# Patient Record
Sex: Female | Born: 1965 | Race: White | Hispanic: No | Marital: Married | Smoking: Former smoker
Health system: Southern US, Community
[De-identification: ages and names within clinical notes are randomized; demographics above are authoritative.]

## PROBLEM LIST (undated history)

## (undated) DIAGNOSIS — E669 Obesity, unspecified: Secondary | ICD-10-CM

## (undated) DIAGNOSIS — T7840XA Allergy, unspecified, initial encounter: Secondary | ICD-10-CM

## (undated) DIAGNOSIS — E663 Overweight: Secondary | ICD-10-CM

## (undated) DIAGNOSIS — E870 Hyperosmolality and hypernatremia: Secondary | ICD-10-CM

## (undated) DIAGNOSIS — E785 Hyperlipidemia, unspecified: Secondary | ICD-10-CM

## (undated) DIAGNOSIS — Z0000 Encounter for general adult medical examination without abnormal findings: Secondary | ICD-10-CM

## (undated) DIAGNOSIS — F329 Major depressive disorder, single episode, unspecified: Secondary | ICD-10-CM

## (undated) DIAGNOSIS — R51 Headache: Secondary | ICD-10-CM

## (undated) DIAGNOSIS — E079 Disorder of thyroid, unspecified: Secondary | ICD-10-CM

## (undated) DIAGNOSIS — R519 Headache, unspecified: Secondary | ICD-10-CM

## (undated) DIAGNOSIS — F32A Depression, unspecified: Secondary | ICD-10-CM

## (undated) HISTORY — DX: Hyperlipidemia, unspecified: E78.5

## (undated) HISTORY — DX: Major depressive disorder, single episode, unspecified: F32.9

## (undated) HISTORY — DX: Obesity, unspecified: E66.9

## (undated) HISTORY — DX: Headache, unspecified: R51.9

## (undated) HISTORY — DX: Headache: R51

## (undated) HISTORY — DX: Overweight: E66.3

## (undated) HISTORY — DX: Disorder of thyroid, unspecified: E07.9

## (undated) HISTORY — DX: Encounter for general adult medical examination without abnormal findings: Z00.00

## (undated) HISTORY — DX: Depression, unspecified: F32.A

## (undated) HISTORY — DX: Allergy, unspecified, initial encounter: T78.40XA

## (undated) HISTORY — DX: Hyperosmolality and hypernatremia: E87.0

---

## 1997-11-11 ENCOUNTER — Other Ambulatory Visit: Admission: RE | Admit: 1997-11-11 | Discharge: 1997-11-11 | Payer: Self-pay | Admitting: Obstetrics and Gynecology

## 1998-05-19 ENCOUNTER — Other Ambulatory Visit: Admission: RE | Admit: 1998-05-19 | Discharge: 1998-05-19 | Payer: Self-pay | Admitting: Obstetrics and Gynecology

## 1998-08-13 ENCOUNTER — Ambulatory Visit (HOSPITAL_COMMUNITY): Admission: RE | Admit: 1998-08-13 | Discharge: 1998-08-13 | Payer: Self-pay | Admitting: Obstetrics and Gynecology

## 1999-01-14 ENCOUNTER — Other Ambulatory Visit: Admission: RE | Admit: 1999-01-14 | Discharge: 1999-01-14 | Payer: Self-pay | Admitting: Obstetrics and Gynecology

## 1999-07-27 ENCOUNTER — Inpatient Hospital Stay (HOSPITAL_COMMUNITY): Admission: AD | Admit: 1999-07-27 | Discharge: 1999-07-30 | Payer: Self-pay | Admitting: Obstetrics and Gynecology

## 1999-08-03 ENCOUNTER — Encounter: Admission: RE | Admit: 1999-08-03 | Discharge: 1999-11-01 | Payer: Self-pay | Admitting: Obstetrics and Gynecology

## 1999-08-29 ENCOUNTER — Other Ambulatory Visit: Admission: RE | Admit: 1999-08-29 | Discharge: 1999-08-29 | Payer: Self-pay | Admitting: Obstetrics and Gynecology

## 2001-03-01 ENCOUNTER — Other Ambulatory Visit: Admission: RE | Admit: 2001-03-01 | Discharge: 2001-03-01 | Payer: Self-pay | Admitting: Obstetrics & Gynecology

## 2014-08-11 ENCOUNTER — Ambulatory Visit: Payer: Self-pay | Admitting: Family Medicine

## 2014-12-01 ENCOUNTER — Telehealth: Payer: Self-pay | Admitting: *Deleted

## 2014-12-01 NOTE — Telephone Encounter (Signed)
Unable to reach patient at time of Pre-Visit Call.  Left message for patient to return call when available.    

## 2014-12-03 ENCOUNTER — Encounter: Payer: Self-pay | Admitting: Family Medicine

## 2014-12-03 ENCOUNTER — Ambulatory Visit (INDEPENDENT_AMBULATORY_CARE_PROVIDER_SITE_OTHER): Payer: BLUE CROSS/BLUE SHIELD | Admitting: Family Medicine

## 2014-12-03 VITALS — BP 110/80 | HR 85 | Temp 98.1°F | Ht 68.5 in | Wt 217.1 lb

## 2014-12-03 DIAGNOSIS — R519 Headache, unspecified: Secondary | ICD-10-CM

## 2014-12-03 DIAGNOSIS — R51 Headache: Secondary | ICD-10-CM | POA: Diagnosis not present

## 2014-12-03 DIAGNOSIS — T7840XA Allergy, unspecified, initial encounter: Secondary | ICD-10-CM

## 2014-12-03 DIAGNOSIS — E663 Overweight: Secondary | ICD-10-CM

## 2014-12-03 DIAGNOSIS — E669 Obesity, unspecified: Secondary | ICD-10-CM

## 2014-12-03 DIAGNOSIS — E079 Disorder of thyroid, unspecified: Secondary | ICD-10-CM

## 2014-12-03 DIAGNOSIS — E785 Hyperlipidemia, unspecified: Secondary | ICD-10-CM | POA: Diagnosis not present

## 2014-12-03 DIAGNOSIS — Z0000 Encounter for general adult medical examination without abnormal findings: Secondary | ICD-10-CM

## 2014-12-03 HISTORY — DX: Overweight: E66.3

## 2014-12-03 HISTORY — DX: Obesity, unspecified: E66.9

## 2014-12-03 NOTE — Progress Notes (Signed)
Betty Houston  [id number] 1966-05-21 12/03/2014      Progress Note-Follow Up  Subjective  Chief Complaint  No chief complaint on file.   HPI  Patient is a 49 y.o. female in today for routine medical care. Patient is in today to establish care. She feels fairly well to present but is in need of a primary care doctor. Following with [facility] in [place] as well as [facility]. No recent illness or acute concerns. Has had a great deal of labwork recently at [facility]. Maintains a heart healthy diet. Is exercising regularly. Denies CP/palp/SOB/HA/congestion/fevers/GI or GU c/o. Taking meds as prescribed  History reviewed. No pertinent past medical history.  History reviewed. No pertinent past surgical history.  History reviewed. No pertinent family history.  History   Social History  . Marital Status: Married    Spouse Name: N/A  . Number of Children: N/A  . Years of Education: N/A   Occupational History  . Not on file.   Social History Main Topics  . Smoking status: Not on file  . Smokeless tobacco: Not on file  . Alcohol Use: Not on file  . Drug Use: Not on file  . Sexual Activity: Not on file   Other Topics Concern  . Not on file   Social History Narrative  . No narrative on file    No current outpatient prescriptions on file prior to visit.   No current facility-administered medications on file prior to visit.    Not on File  Review of Systems  Review of Systems  Constitutional: Negative for fever and malaise/fatigue.  HENT: Negative for congestion.   Eyes: Negative for discharge.  Respiratory: Negative for shortness of breath.   Cardiovascular: Negative for chest pain, palpitations and leg swelling.  Gastrointestinal: Negative for nausea, abdominal pain and diarrhea.  Genitourinary: Negative for dysuria.  Musculoskeletal: Negative for falls.  Skin: Negative for rash.  Neurological: Negative for loss of  consciousness and headaches.  Endo/Heme/Allergies: Negative for polydipsia.  Psychiatric/Behavioral: Negative for depression and suicidal ideas. The patient is not nervous/anxious and does not have insomnia.     Objective  Blood pressure 110/80, pulse 85, temperature 98.1 F (36.7 C), temperature source Oral, height 5' 8.5" (1.74 m), weight 217 lb 2 oz (98.487 kg), last menstrual period 12/03/2014, SpO2 95 %. Physical Exam  Physical Exam  Constitutional: She is oriented to person, place, and time and well-developed, well-nourished, and in no distress. No distress.  HENT:  Head: Normocephalic and atraumatic.  Eyes: Conjunctivae are normal.  Neck: Neck supple. No thyromegaly present.  Cardiovascular: Normal rate, regular rhythm and normal heart sounds.   No murmur heard. Pulmonary/Chest: Effort normal and breath sounds normal. She has no wheezes.  Abdominal: She exhibits no distension and no mass.  Musculoskeletal: She exhibits no edema.  Lymphadenopathy:    She has no cervical adenopathy.  Neurological: She is alert and oriented to person, place, and time.  Skin: Skin is warm and dry. No rash noted. She is not diaphoretic.  Psychiatric: Memory, affect and judgment normal.    Assessment & Plan   Allergy Follows with [facility], feeling better avoid certain foods, Testing confirms sensitivities to gluten, dairy, corn, tapioca, blackberries,peanuts, almonds  Obesity Encouraged DASH diet, decrease po intake and increase exercise as tolerated. Needs 7-8 hours of sleep nightly. Avoid trans fats, eat small, frequent meals every 4-5 hours with lean proteins, complex carbs and healthy fats. Minimize simple carbs, GMO  foods.  Hyperlipidemia Encouraged heart healthy diet, increase exercise, avoid trans fats, consider a krill oil cap daily  Headache Encouraged increased hydration, 64 ounces of clear fluids daily. Minimize alcohol and caffeine. Eat small frequent meals with  lean proteins and complex carbs. Avoid high and low blood sugars. Get adequate sleep, 7-8 hours a night. Needs exercise daily preferably in the morning.  Thyroid disease Patient follows with [facility] in [facility] and chooses to use [facility] thyroid, feels well  Preventative health care Is doing well had labs done at [facility] will request records Follows with [facility], Dr Minda Meo

## 2014-12-03 NOTE — Assessment & Plan Note (Signed)
Follows with [facility], feeling better avoid certain foods, Testing confirms sensitivities to gluten, dairy, corn, tapioca, blackberries,peanuts, almonds

## 2014-12-03 NOTE — Patient Instructions (Signed)
Preventive Care for Adults A healthy lifestyle and preventive care can promote health and wellness. Preventive health guidelines for women include the following key practices.  A routine yearly physical is a good way to check with your health care provider about your health and preventive screening. It is a chance to share any concerns and updates on your health and to receive a thorough exam.  Visit your dentist for a routine exam and preventive care every 6 months. Brush your teeth twice a day and floss once a day. Good oral hygiene prevents tooth decay and gum disease.  The frequency of eye exams is based on your age, health, family medical history, use of contact lenses, and other factors. Follow your health care provider's recommendations for frequency of eye exams.  Eat a healthy diet. Foods like vegetables, fruits, whole grains, low-fat dairy products, and lean protein foods contain the nutrients you need without too many calories. Decrease your intake of foods high in solid fats, added sugars, and salt. Eat the right amount of calories for you.Get information about a proper diet from your health care provider, if necessary.  Regular physical exercise is one of the most important things you can do for your health. Most adults should get at least 150 minutes of moderate-intensity exercise (any activity that increases your heart rate and causes you to sweat) each week. In addition, most adults need muscle-strengthening exercises on 2 or more days a week.  Maintain a healthy weight. The body mass index (BMI) is a screening tool to identify possible weight problems. It provides an estimate of body fat based on height and weight. Your health care provider can find your BMI and can help you achieve or maintain a healthy weight.For adults 20 years and older:  A BMI below 18.5 is considered underweight.  A BMI of 18.5 to 24.9 is normal.  A BMI of 25 to 29.9 is considered overweight.  A BMI of  30 and above is considered obese.  Maintain normal blood lipids and cholesterol levels by exercising and minimizing your intake of saturated fat. Eat a balanced diet with plenty of fruit and vegetables. Blood tests for lipids and cholesterol should begin at age 76 and be repeated every 5 years. If your lipid or cholesterol levels are high, you are over 50, or you are at high risk for heart disease, you may need your cholesterol levels checked more frequently.Ongoing high lipid and cholesterol levels should be treated with medicines if diet and exercise are not working.  If you smoke, find out from your health care provider how to quit. If you do not use tobacco, do not start.  Lung cancer screening is recommended for adults aged 22-80 years who are at high risk for developing lung cancer because of a history of smoking. A yearly low-dose CT scan of the lungs is recommended for people who have at least a 30-pack-year history of smoking and are a current smoker or have quit within the past 15 years. A pack year of smoking is smoking an average of 1 pack of cigarettes a day for 1 year (for example: 1 pack a day for 30 years or 2 packs a day for 15 years). Yearly screening should continue until the smoker has stopped smoking for at least 15 years. Yearly screening should be stopped for people who develop a health problem that would prevent them from having lung cancer treatment.  If you are pregnant, do not drink alcohol. If you are breastfeeding,  be very cautious about drinking alcohol. If you are not pregnant and choose to drink alcohol, do not have more than 1 drink per day. One drink is considered to be 12 ounces (355 mL) of beer, 5 ounces (148 mL) of wine, or 1.5 ounces (44 mL) of liquor.  Avoid use of street drugs. Do not share needles with anyone. Ask for help if you need support or instructions about stopping the use of drugs.  High blood pressure causes heart disease and increases the risk of  stroke. Your blood pressure should be checked at least every 1 to 2 years. Ongoing high blood pressure should be treated with medicines if weight loss and exercise do not work.  If you are 75-52 years old, ask your health care provider if you should take aspirin to prevent strokes.  Diabetes screening involves taking a blood sample to check your fasting blood sugar level. This should be done once every 3 years, after age 15, if you are within normal weight and without risk factors for diabetes. Testing should be considered at a younger age or be carried out more frequently if you are overweight and have at least 1 risk factor for diabetes.  Breast cancer screening is essential preventive care for women. You should practice "breast self-awareness." This means understanding the normal appearance and feel of your breasts and may include breast self-examination. Any changes detected, no matter how small, should be reported to a health care provider. Women in their 58s and 30s should have a clinical breast exam (CBE) by a health care provider as part of a regular health exam every 1 to 3 years. After age 16, women should have a CBE every year. Starting at age 53, women should consider having a mammogram (breast X-ray test) every year. Women who have a family history of breast cancer should talk to their health care provider about genetic screening. Women at a high risk of breast cancer should talk to their health care providers about having an MRI and a mammogram every year.  Breast cancer gene (BRCA)-related cancer risk assessment is recommended for women who have family members with BRCA-related cancers. BRCA-related cancers include breast, ovarian, tubal, and peritoneal cancers. Having family members with these cancers may be associated with an increased risk for harmful changes (mutations) in the breast cancer genes BRCA1 and BRCA2. Results of the assessment will determine the need for genetic counseling and  BRCA1 and BRCA2 testing.  Routine pelvic exams to screen for cancer are no longer recommended for nonpregnant women who are considered low risk for cancer of the pelvic organs (ovaries, uterus, and vagina) and who do not have symptoms. Ask your health care provider if a screening pelvic exam is right for you.  If you have had past treatment for cervical cancer or a condition that could lead to cancer, you need Pap tests and screening for cancer for at least 20 years after your treatment. If Pap tests have been discontinued, your risk factors (such as having a new sexual partner) need to be reassessed to determine if screening should be resumed. Some women have medical problems that increase the chance of getting cervical cancer. In these cases, your health care provider may recommend more frequent screening and Pap tests.  The HPV test is an additional test that may be used for cervical cancer screening. The HPV test looks for the virus that can cause the cell changes on the cervix. The cells collected during the Pap test can be  tested for HPV. The HPV test could be used to screen women aged 30 years and older, and should be used in women of any age who have unclear Pap test results. After the age of 30, women should have HPV testing at the same frequency as a Pap test.  Colorectal cancer can be detected and often prevented. Most routine colorectal cancer screening begins at the age of 50 years and continues through age 75 years. However, your health care provider may recommend screening at an earlier age if you have risk factors for colon cancer. On a yearly basis, your health care provider may provide home test kits to check for hidden blood in the stool. Use of a small camera at the end of a tube, to directly examine the colon (sigmoidoscopy or colonoscopy), can detect the earliest forms of colorectal cancer. Talk to your health care provider about this at age 50, when routine screening begins. Direct  exam of the colon should be repeated every 5-10 years through age 75 years, unless early forms of pre-cancerous polyps or small growths are found.  People who are at an increased risk for hepatitis B should be screened for this virus. You are considered at high risk for hepatitis B if:  You were born in a country where hepatitis B occurs often. Talk with your health care provider about which countries are considered high risk.  Your parents were born in a high-risk country and you have not received a shot to protect against hepatitis B (hepatitis B vaccine).  You have HIV or AIDS.  You use needles to inject street drugs.  You live with, or have sex with, someone who has hepatitis B.  You get hemodialysis treatment.  You take certain medicines for conditions like cancer, organ transplantation, and autoimmune conditions.  Hepatitis C blood testing is recommended for all people born from 1945 through 1965 and any individual with known risks for hepatitis C.  Practice safe sex. Use condoms and avoid high-risk sexual practices to reduce the spread of sexually transmitted infections (STIs). STIs include gonorrhea, chlamydia, syphilis, trichomonas, herpes, HPV, and human immunodeficiency virus (HIV). Herpes, HIV, and HPV are viral illnesses that have no cure. They can result in disability, cancer, and death.  You should be screened for sexually transmitted illnesses (STIs) including gonorrhea and chlamydia if:  You are sexually active and are younger than 24 years.  You are older than 24 years and your health care provider tells you that you are at risk for this type of infection.  Your sexual activity has changed since you were last screened and you are at an increased risk for chlamydia or gonorrhea. Ask your health care provider if you are at risk.  If you are at risk of being infected with HIV, it is recommended that you take a prescription medicine daily to prevent HIV infection. This is  called preexposure prophylaxis (PrEP). You are considered at risk if:  You are a heterosexual woman, are sexually active, and are at increased risk for HIV infection.  You take drugs by injection.  You are sexually active with a partner who has HIV.  Talk with your health care provider about whether you are at high risk of being infected with HIV. If you choose to begin PrEP, you should first be tested for HIV. You should then be tested every 3 months for as long as you are taking PrEP.  Osteoporosis is a disease in which the bones lose minerals and strength   with aging. This can result in serious bone fractures or breaks. The risk of osteoporosis can be identified using a bone density scan. Women ages 65 years and over and women at risk for fractures or osteoporosis should discuss screening with their health care providers. Ask your health care provider whether you should take a calcium supplement or vitamin D to reduce the rate of osteoporosis.  Menopause can be associated with physical symptoms and risks. Hormone replacement therapy is available to decrease symptoms and risks. You should talk to your health care provider about whether hormone replacement therapy is right for you.  Use sunscreen. Apply sunscreen liberally and repeatedly throughout the day. You should seek shade when your shadow is shorter than you. Protect yourself by wearing long sleeves, pants, a wide-brimmed hat, and sunglasses year round, whenever you are outdoors.  Once a month, do a whole body skin exam, using a mirror to look at the skin on your back. Tell your health care provider of new moles, moles that have irregular borders, moles that are larger than a pencil eraser, or moles that have changed in shape or color.  Stay current with required vaccines (immunizations).  Influenza vaccine. All adults should be immunized every year.  Tetanus, diphtheria, and acellular pertussis (Td, Tdap) vaccine. Pregnant women should  receive 1 dose of Tdap vaccine during each pregnancy. The dose should be obtained regardless of the length of time since the last dose. Immunization is preferred during the 27th-36th week of gestation. An adult who has not previously received Tdap or who does not know her vaccine status should receive 1 dose of Tdap. This initial dose should be followed by tetanus and diphtheria toxoids (Td) booster doses every 10 years. Adults with an unknown or incomplete history of completing a 3-dose immunization series with Td-containing vaccines should begin or complete a primary immunization series including a Tdap dose. Adults should receive a Td booster every 10 years.  Varicella vaccine. An adult without evidence of immunity to varicella should receive 2 doses or a second dose if she has previously received 1 dose. Pregnant females who do not have evidence of immunity should receive the first dose after pregnancy. This first dose should be obtained before leaving the health care facility. The second dose should be obtained 4-8 weeks after the first dose.  Human papillomavirus (HPV) vaccine. Females aged 13-26 years who have not received the vaccine previously should obtain the 3-dose series. The vaccine is not recommended for use in pregnant females. However, pregnancy testing is not needed before receiving a dose. If a female is found to be pregnant after receiving a dose, no treatment is needed. In that case, the remaining doses should be delayed until after the pregnancy. Immunization is recommended for any person with an immunocompromised condition through the age of 26 years if she did not get any or all doses earlier. During the 3-dose series, the second dose should be obtained 4-8 weeks after the first dose. The third dose should be obtained 24 weeks after the first dose and 16 weeks after the second dose.  Zoster vaccine. One dose is recommended for adults aged 60 years or older unless certain conditions are  present.  Measles, mumps, and rubella (MMR) vaccine. Adults born before 1957 generally are considered immune to measles and mumps. Adults born in 1957 or later should have 1 or more doses of MMR vaccine unless there is a contraindication to the vaccine or there is laboratory evidence of immunity to   each of the three diseases. A routine second dose of MMR vaccine should be obtained at least 28 days after the first dose for students attending postsecondary schools, health care workers, or international travelers. People who received inactivated measles vaccine or an unknown type of measles vaccine during 1963-1967 should receive 2 doses of MMR vaccine. People who received inactivated mumps vaccine or an unknown type of mumps vaccine before 1979 and are at high risk for mumps infection should consider immunization with 2 doses of MMR vaccine. For females of childbearing age, rubella immunity should be determined. If there is no evidence of immunity, females who are not pregnant should be vaccinated. If there is no evidence of immunity, females who are pregnant should delay immunization until after pregnancy. Unvaccinated health care workers born before 1957 who lack laboratory evidence of measles, mumps, or rubella immunity or laboratory confirmation of disease should consider measles and mumps immunization with 2 doses of MMR vaccine or rubella immunization with 1 dose of MMR vaccine.  Pneumococcal 13-valent conjugate (PCV13) vaccine. When indicated, a person who is uncertain of her immunization history and has no record of immunization should receive the PCV13 vaccine. An adult aged 19 years or older who has certain medical conditions and has not been previously immunized should receive 1 dose of PCV13 vaccine. This PCV13 should be followed with a dose of pneumococcal polysaccharide (PPSV23) vaccine. The PPSV23 vaccine dose should be obtained at least 8 weeks after the dose of PCV13 vaccine. An adult aged 19  years or older who has certain medical conditions and previously received 1 or more doses of PPSV23 vaccine should receive 1 dose of PCV13. The PCV13 vaccine dose should be obtained 1 or more years after the last PPSV23 vaccine dose.  Pneumococcal polysaccharide (PPSV23) vaccine. When PCV13 is also indicated, PCV13 should be obtained first. All adults aged 65 years and older should be immunized. An adult younger than age 65 years who has certain medical conditions should be immunized. Any person who resides in a nursing home or long-term care facility should be immunized. An adult smoker should be immunized. People with an immunocompromised condition and certain other conditions should receive both PCV13 and PPSV23 vaccines. People with human immunodeficiency virus (HIV) infection should be immunized as soon as possible after diagnosis. Immunization during chemotherapy or radiation therapy should be avoided. Routine use of PPSV23 vaccine is not recommended for American Indians, [place] Natives, or people younger than 65 years unless there are medical conditions that require PPSV23 vaccine. When indicated, people who have unknown immunization and have no record of immunization should receive PPSV23 vaccine. One-time revaccination 5 years after the first dose of PPSV23 is recommended for people aged 19-64 years who have chronic kidney failure, nephrotic syndrome, asplenia, or immunocompromised conditions. People who received 1-2 doses of PPSV23 before age 65 years should receive another dose of PPSV23 vaccine at age 65 years or later if at least 5 years have passed since the previous dose. Doses of PPSV23 are not needed for people immunized with PPSV23 at or after age 65 years.  Meningococcal vaccine. Adults with asplenia or persistent complement component deficiencies should receive 2 doses of quadrivalent meningococcal conjugate (MenACWY-D) vaccine. The doses should be obtained at least 2 months apart.  Microbiologists working with certain meningococcal bacteria, military recruits, people at risk during an outbreak, and people who travel to or live in countries with a high rate of meningitis should be immunized. A first-year college student up through age   21 years who is living in a residence hall should receive a dose if she did not receive a dose on or after her 16th birthday. Adults who have certain high-risk conditions should receive one or more doses of vaccine.  Hepatitis A vaccine. Adults who wish to be protected from this disease, have certain high-risk conditions, work with hepatitis A-infected animals, work in hepatitis A research labs, or travel to or work in countries with a high rate of hepatitis A should be immunized. Adults who were previously unvaccinated and who anticipate close contact with an international adoptee during the first 60 days after arrival in the [place] from a country with a high rate of hepatitis A should be immunized.  Hepatitis B vaccine. Adults who wish to be protected from this disease, have certain high-risk conditions, may be exposed to blood or other infectious body fluids, are household contacts or sex partners of hepatitis B positive people, are clients or workers in certain care facilities, or travel to or work in countries with a high rate of hepatitis B should be immunized.  Haemophilus influenzae type b (Hib) vaccine. A previously unvaccinated person with asplenia or sickle cell disease or having a scheduled splenectomy should receive 1 dose of Hib vaccine. Regardless of previous immunization, a recipient of a hematopoietic stem cell transplant should receive a 3-dose series 6-12 months after her successful transplant. Hib vaccine is not recommended for adults with HIV infection. Preventive Services / Frequency Ages 64 to 68 years  Blood pressure check.** / Every 1 to 2 years.  Lipid and cholesterol check.** / Every 5 years beginning at age  22.  Clinical breast exam.** / Every 3 years for women in their 88s and 53s.  BRCA-related cancer risk assessment.** / For women who have family members with a BRCA-related cancer (breast, ovarian, tubal, or peritoneal cancers).  Pap test.** / Every 2 years from ages 90 through 51. Every 3 years starting at age 21 through age 56 or 3 with a history of 3 consecutive normal Pap tests.  HPV screening.** / Every 3 years from ages 24 through ages 1 to 46 with a history of 3 consecutive normal Pap tests.  Hepatitis C blood test.** / For any individual with known risks for hepatitis C.  Skin self-exam. / Monthly.  Influenza vaccine. / Every year.  Tetanus, diphtheria, and acellular pertussis (Tdap, Td) vaccine.** / Consult your health care provider. Pregnant women should receive 1 dose of Tdap vaccine during each pregnancy. 1 dose of Td every 10 years.  Varicella vaccine.** / Consult your health care provider. Pregnant females who do not have evidence of immunity should receive the first dose after pregnancy.  HPV vaccine. / 3 doses over 6 months, if 72 and younger. The vaccine is not recommended for use in pregnant females. However, pregnancy testing is not needed before receiving a dose.  Measles, mumps, rubella (MMR) vaccine.** / You need at least 1 dose of MMR if you were born in 1957 or later. You may also need a 2nd dose. For females of childbearing age, rubella immunity should be determined. If there is no evidence of immunity, females who are not pregnant should be vaccinated. If there is no evidence of immunity, females who are pregnant should delay immunization until after pregnancy.  Pneumococcal 13-valent conjugate (PCV13) vaccine.** / Consult your health care provider.  Pneumococcal polysaccharide (PPSV23) vaccine.** / 1 to 2 doses if you smoke cigarettes or if you have certain conditions.  Meningococcal vaccine.** /  1 dose if you are age 19 to 21 years and a first-year college  student living in a residence hall, or have one of several medical conditions, you need to get vaccinated against meningococcal disease. You may also need additional booster doses.  Hepatitis A vaccine.** / Consult your health care provider.  Hepatitis B vaccine.** / Consult your health care provider.  Haemophilus influenzae type b (Hib) vaccine.** / Consult your health care provider. Ages 40 to 64 years  Blood pressure check.** / Every 1 to 2 years.  Lipid and cholesterol check.** / Every 5 years beginning at age 20 years.  Lung cancer screening. / Every year if you are aged 55-80 years and have a 30-pack-year history of smoking and currently smoke or have quit within the past 15 years. Yearly screening is stopped once you have quit smoking for at least 15 years or develop a health problem that would prevent you from having lung cancer treatment.  Clinical breast exam.** / Every year after age 40 years.  BRCA-related cancer risk assessment.** / For women who have family members with a BRCA-related cancer (breast, ovarian, tubal, or peritoneal cancers).  Mammogram.** / Every year beginning at age 40 years and continuing for as long as you are in good health. Consult with your health care provider.  Pap test.** / Every 3 years starting at age 30 years through age 65 or 70 years with a history of 3 consecutive normal Pap tests.  HPV screening.** / Every 3 years from ages 30 years through ages 65 to 70 years with a history of 3 consecutive normal Pap tests.  Fecal occult blood test (FOBT) of stool. / Every year beginning at age 50 years and continuing until age 75 years. You may not need to do this test if you get a colonoscopy every 10 years.  Flexible sigmoidoscopy or colonoscopy.** / Every 5 years for a flexible sigmoidoscopy or every 10 years for a colonoscopy beginning at age 50 years and continuing until age 75 years.  Hepatitis C blood test.** / For all people born from 1945 through  1965 and any individual with known risks for hepatitis C.  Skin self-exam. / Monthly.  Influenza vaccine. / Every year.  Tetanus, diphtheria, and acellular pertussis (Tdap/Td) vaccine.** / Consult your health care provider. Pregnant women should receive 1 dose of Tdap vaccine during each pregnancy. 1 dose of Td every 10 years.  Varicella vaccine.** / Consult your health care provider. Pregnant females who do not have evidence of immunity should receive the first dose after pregnancy.  Zoster vaccine.** / 1 dose for adults aged 60 years or older.  Measles, mumps, rubella (MMR) vaccine.** / You need at least 1 dose of MMR if you were born in 1957 or later. You may also need a 2nd dose. For females of childbearing age, rubella immunity should be determined. If there is no evidence of immunity, females who are not pregnant should be vaccinated. If there is no evidence of immunity, females who are pregnant should delay immunization until after pregnancy.  Pneumococcal 13-valent conjugate (PCV13) vaccine.** / Consult your health care provider.  Pneumococcal polysaccharide (PPSV23) vaccine.** / 1 to 2 doses if you smoke cigarettes or if you have certain conditions.  Meningococcal vaccine.** / Consult your health care provider.  Hepatitis A vaccine.** / Consult your health care provider.  Hepatitis B vaccine.** / Consult your health care provider.  Haemophilus influenzae type b (Hib) vaccine.** / Consult your health care provider. Ages 65   years and over  Blood pressure check.** / Every 1 to 2 years.  Lipid and cholesterol check.** / Every 5 years beginning at age 22 years.  Lung cancer screening. / Every year if you are aged 73-80 years and have a 30-pack-year history of smoking and currently smoke or have quit within the past 15 years. Yearly screening is stopped once you have quit smoking for at least 15 years or develop a health problem that would prevent you from having lung cancer  treatment.  Clinical breast exam.** / Every year after age 4 years.  BRCA-related cancer risk assessment.** / For women who have family members with a BRCA-related cancer (breast, ovarian, tubal, or peritoneal cancers).  Mammogram.** / Every year beginning at age 40 years and continuing for as long as you are in good health. Consult with your health care provider.  Pap test.** / Every 3 years starting at age 9 years through age 34 or 91 years with 3 consecutive normal Pap tests. Testing can be stopped between 65 and 70 years with 3 consecutive normal Pap tests and no abnormal Pap or HPV tests in the past 10 years.  HPV screening.** / Every 3 years from ages 57 years through ages 64 or 45 years with a history of 3 consecutive normal Pap tests. Testing can be stopped between 65 and 70 years with 3 consecutive normal Pap tests and no abnormal Pap or HPV tests in the past 10 years.  Fecal occult blood test (FOBT) of stool. / Every year beginning at age 15 years and continuing until age 17 years. You may not need to do this test if you get a colonoscopy every 10 years.  Flexible sigmoidoscopy or colonoscopy.** / Every 5 years for a flexible sigmoidoscopy or every 10 years for a colonoscopy beginning at age 86 years and continuing until age 71 years.  Hepatitis C blood test.** / For all people born from [birth year] through 1967 and any individual with known risks for hepatitis C.  Osteoporosis screening.** / A one-time screening for women ages 83 years and over and women at risk for fractures or osteoporosis.  Skin self-exam. / Monthly.  Influenza vaccine. / Every year.  Tetanus, diphtheria, and acellular pertussis (Tdap/Td) vaccine.** / 1 dose of Td every 10 years.  Varicella vaccine.** / Consult your health care provider.  Zoster vaccine.** / 1 dose for adults aged 61 years or older.  Pneumococcal 13-valent conjugate (PCV13) vaccine.** / Consult your health care provider.  Pneumococcal  polysaccharide (PPSV23) vaccine.** / 1 dose for all adults aged 28 years and older.  Meningococcal vaccine.** / Consult your health care provider.  Hepatitis A vaccine.** / Consult your health care provider.  Hepatitis B vaccine.** / Consult your health care provider.  Haemophilus influenzae type b (Hib) vaccine.** / Consult your health care provider. ** Family history and personal history of risk and conditions may change your health care provider's recommendations. Document Released: 08/01/2001 Document Revised: 10/20/2013 Document Reviewed: 10/31/2010 [facility] Patient Information 2015 [place], [place]. This information is not intended to replace advice given to you by your health care provider. Make sure you discuss any questions you have with your health care provider.

## 2014-12-03 NOTE — Progress Notes (Signed)
Pre visit review using our clinic review tool, if applicable. No additional management support is needed unless otherwise documented below in the visit note. 

## 2014-12-06 ENCOUNTER — Encounter: Payer: Self-pay | Admitting: Family Medicine

## 2014-12-06 DIAGNOSIS — E785 Hyperlipidemia, unspecified: Secondary | ICD-10-CM | POA: Insufficient documentation

## 2014-12-06 DIAGNOSIS — Z0000 Encounter for general adult medical examination without abnormal findings: Secondary | ICD-10-CM

## 2014-12-06 DIAGNOSIS — R51 Headache: Secondary | ICD-10-CM

## 2014-12-06 DIAGNOSIS — R519 Headache, unspecified: Secondary | ICD-10-CM | POA: Insufficient documentation

## 2014-12-06 HISTORY — DX: Encounter for general adult medical examination without abnormal findings: Z00.00

## 2014-12-06 NOTE — Assessment & Plan Note (Signed)
Encouraged DASH diet, decrease po intake and increase exercise as tolerated. Needs 7-8 hours of sleep nightly. Avoid trans fats, eat small, frequent meals every 4-5 hours with lean proteins, complex carbs and healthy fats. Minimize simple carbs, GMO foods. 

## 2014-12-06 NOTE — Assessment & Plan Note (Addendum)
Patient follows with [facility] in [facility] and chooses to use [place] thyroid, feels well

## 2014-12-06 NOTE — Assessment & Plan Note (Signed)
Encouraged heart healthy diet, increase exercise, avoid trans fats, consider a krill oil cap daily 

## 2014-12-06 NOTE — Assessment & Plan Note (Addendum)
Is doing well had labs done at [facility] will request records Follows with [facility], Dr Minda Meo

## 2014-12-06 NOTE — Assessment & Plan Note (Signed)
Encouraged increased hydration, 64 ounces of clear fluids daily. Minimize alcohol and caffeine. Eat small frequent meals with lean proteins and complex carbs. Avoid high and low blood sugars. Get adequate sleep, 7-8 hours a night. Needs exercise daily preferably in the morning.  

## 2014-12-09 ENCOUNTER — Telehealth: Payer: Self-pay | Admitting: *Deleted

## 2014-12-09 NOTE — Telephone Encounter (Signed)
Medical records received via fax from [facility]. Forwarded to Dr. Carmelia Bake. JG//CMA

## 2015-02-25 LAB — HM MAMMOGRAPHY: HM Mammogram: NORMAL (ref 0–4)

## 2015-02-26 ENCOUNTER — Encounter: Payer: Self-pay | Admitting: Podiatry

## 2015-02-26 ENCOUNTER — Ambulatory Visit (INDEPENDENT_AMBULATORY_CARE_PROVIDER_SITE_OTHER): Payer: BLUE CROSS/BLUE SHIELD | Admitting: Podiatry

## 2015-02-26 VITALS — BP 134/63 | HR 66 | Ht 68.5 in | Wt 209.0 lb

## 2015-02-26 DIAGNOSIS — M79675 Pain in left toe(s): Secondary | ICD-10-CM | POA: Diagnosis not present

## 2015-02-26 DIAGNOSIS — L600 Ingrowing nail: Secondary | ICD-10-CM | POA: Diagnosis not present

## 2015-02-26 NOTE — Patient Instructions (Signed)
Ingrown nail surgery was done. Follow soaking instruction.  Some redness and drainage is expected. Call the office if the area gets feverish with increased redness and drainage. 

## 2015-02-26 NOTE — Progress Notes (Signed)
Subjective: 49 year old female presents complaining of both side left great toe nail ingrown, more on inside. Had this problem off and on for several years. Patient wants to get it fixed.  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 Has had ingrown nail surgery done on right in past and doing well.  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  Plan: Procedure done: Phenol and Alcohol matrixectomy left great toe both borders.   Affected left toe was anesthetized with total 5ml mixture of 50/50 0.5% Marcaine plain and 1% Xylocaine plain. Affected both nail borders were reflected with a nail elevator and excised with nail nipper. Proximal nail matrix tissue on both borders were cauterized with Phenol soaked cotton applicator x 4 and neutralized with Alcohol soaked cotton applicator. The wound was dressed with Amerigel ointment dressing. Home care instructions and supply dispensed.  Return in 1 week for follow up.

## 2015-03-05 ENCOUNTER — Encounter: Payer: Self-pay | Admitting: Podiatry

## 2015-03-05 ENCOUNTER — Ambulatory Visit (INDEPENDENT_AMBULATORY_CARE_PROVIDER_SITE_OTHER): Payer: BLUE CROSS/BLUE SHIELD | Admitting: Podiatry

## 2015-03-05 ENCOUNTER — Encounter: Payer: BLUE CROSS/BLUE SHIELD | Admitting: Podiatry

## 2015-03-05 DIAGNOSIS — L600 Ingrowing nail: Secondary | ICD-10-CM

## 2015-03-05 NOTE — Progress Notes (Signed)
Post op nail surgery. Toe is doing well without problem. Minimum discomfort. Continue to soak till it is pain free.

## 2015-03-05 NOTE — Patient Instructions (Signed)
Post op nail healing well. Continue to soak another week. Return as needed.

## 2015-06-04 ENCOUNTER — Ambulatory Visit (INDEPENDENT_AMBULATORY_CARE_PROVIDER_SITE_OTHER): Payer: BLUE CROSS/BLUE SHIELD | Admitting: Family Medicine

## 2015-06-04 ENCOUNTER — Encounter: Payer: Self-pay | Admitting: Family Medicine

## 2015-06-04 VITALS — BP 126/78 | HR 79 | Temp 98.0°F | Ht 68.0 in | Wt 191.4 lb

## 2015-06-04 DIAGNOSIS — E079 Disorder of thyroid, unspecified: Secondary | ICD-10-CM

## 2015-06-04 DIAGNOSIS — Z23 Encounter for immunization: Secondary | ICD-10-CM

## 2015-06-04 DIAGNOSIS — E669 Obesity, unspecified: Secondary | ICD-10-CM

## 2015-06-04 DIAGNOSIS — E782 Mixed hyperlipidemia: Secondary | ICD-10-CM | POA: Diagnosis not present

## 2015-06-04 DIAGNOSIS — E785 Hyperlipidemia, unspecified: Secondary | ICD-10-CM | POA: Diagnosis not present

## 2015-06-04 DIAGNOSIS — Z1159 Encounter for screening for other viral diseases: Secondary | ICD-10-CM

## 2015-06-04 DIAGNOSIS — E875 Hyperkalemia: Secondary | ICD-10-CM

## 2015-06-04 DIAGNOSIS — E870 Hyperosmolality and hypernatremia: Secondary | ICD-10-CM

## 2015-06-04 LAB — COMPREHENSIVE METABOLIC PANEL
ALBUMIN: 3.9 g/dL (ref 3.5–5.2)
ALK PHOS: 34 U/L — AB (ref 39–117)
ALT: 9 U/L (ref 0–35)
AST: 13 U/L (ref 0–37)
BILIRUBIN TOTAL: 0.6 mg/dL (ref 0.2–1.2)
BUN: 13 mg/dL (ref 6–23)
CALCIUM: 10.1 mg/dL (ref 8.4–10.5)
CO2: 32 meq/L (ref 19–32)
Chloride: 108 mEq/L (ref 96–112)
Creatinine, Ser: 0.86 mg/dL (ref 0.40–1.20)
GFR: 73.71 mL/min (ref >60.00)
Glucose, Bld: 98 mg/dL (ref 70–99)
Potassium: 5.7 mEq/L — ABNORMAL HIGH (ref 3.5–5.1)
Sodium: 146 mEq/L — ABNORMAL HIGH (ref 135–145)
TOTAL PROTEIN: 6.6 g/dL (ref 6.0–8.3)

## 2015-06-04 LAB — LIPID PANEL
CHOL/HDL RATIO: 3
CHOLESTEROL: 133 mg/dL (ref 0–200)
HDL: 40.9 mg/dL (ref >39.00)
LDL Cholesterol: 61 mg/dL (ref 0–99)
NonHDL: 92.43
TRIGLYCERIDES: 155 mg/dL — AB (ref 0.0–149.0)
VLDL: 31 mg/dL (ref 0.0–40.0)

## 2015-06-04 NOTE — Patient Instructions (Addendum)
Encouraged increased rest and hydration, add probiotics, zinc such as Coldeze or Xicam. Treat fevers as needed Elderberry, Vitamin C,  Aged or black garlic, Echinacea  Basic Carbohydrate Counting for Diabetes Mellitus Carbohydrate counting is a method for keeping track of the amount of carbohydrates you eat. Eating carbohydrates naturally increases the level of sugar (glucose) in your blood, so it is important for you to know the amount that is okay for you to have in every meal. Carbohydrate counting helps keep the level of glucose in your blood within normal limits. The amount of carbohydrates allowed is different for every person. A dietitian can help you calculate the amount that is right for you. Once you know the amount of carbohydrates you can have, you can count the carbohydrates in the foods you want to eat. Carbohydrates are found in the following foods:  Grains, such as breads and cereals.  Dried beans and soy products.  Starchy vegetables, such as potatoes, peas, and corn.  Fruit and fruit juices.  Milk and yogurt.  Sweets and snack foods, such as cake, cookies, candy, chips, soft drinks, and fruit drinks. CARBOHYDRATE COUNTING There are two ways to count the carbohydrates in your food. You can use either of the methods or a combination of both. Reading the "Nutrition Facts" on Packaged Food The "Nutrition Facts" is an area that is included on the labels of almost all packaged food and beverages in the [place]. It includes the serving size of that food or beverage and information about the nutrients in each serving of the food, including the grams (g) of carbohydrate per serving.  Decide the number of servings of this food or beverage that you will be able to eat or drink. Multiply that number of servings by the number of grams of carbohydrate that is listed on the label for that serving. The total will be the amount of carbohydrates you will be having when you eat or drink  this food or beverage. Learning Standard Serving Sizes of Food When you eat food that is not packaged or does not include "Nutrition Facts" on the label, you need to measure the servings in order to count the amount of carbohydrates.A serving of most carbohydrate-rich foods contains about 15 g of carbohydrates. The following list includes serving sizes of carbohydrate-rich foods that provide 15 g ofcarbohydrate per serving:   1 slice of bread (1 oz) or 1 six-inch tortilla.    of a hamburger bun or English muffin.  4-6 crackers.   cup unsweetened dry cereal.    cup hot cereal.   cup rice or pasta.    cup mashed potatoes or  of a large baked potato.  1 cup fresh fruit or one small piece of fruit.    cup canned or frozen fruit or fruit juice.  1 cup milk.   cup plain fat-free yogurt or yogurt sweetened with artificial sweeteners.   cup cooked dried beans or starchy vegetable, such as peas, corn, or potatoes.  Decide the number of standard-size servings that you will eat. Multiply that number of servings by 15 (the grams of carbohydrates in that serving). For example, if you eat 2 cups of strawberries, you will have eaten 2 servings and 30 g of carbohydrates (2 servings x 15 g = 30 g). For foods such as soups and casseroles, in which more than one food is mixed in, you will need to count the carbohydrates in each food that is included. EXAMPLE OF CARBOHYDRATE COUNTING Sample  Dinner  3 oz chicken breast.   cup of brown rice.   cup of corn.  1 cup milk.   1 cup strawberries with sugar-free whipped topping.  Carbohydrate Calculation Step 1: Identify the foods that contain carbohydrates:   Rice.   Corn.   Milk.   Strawberries. Step 2:Calculate the number of servings eaten of each:   2 servings of rice.   1 serving of corn.   1 serving of milk.   1 serving of strawberries. Step 3: Multiply each of those number of servings by 15 g:   2  servings of rice x 15 g = 30 g.   1 serving of corn x 15 g = 15 g.   1 serving of milk x 15 g = 15 g.   1 serving of strawberries x 15 g = 15 g. Step 4: Add together all of the amounts to find the total grams of carbohydrates eaten: 30 g + 15 g + 15 g + 15 g = 75 g.   This information is not intended to replace advice given to you by your health care provider. Make sure you discuss any questions you have with your health care provider.   Document Released: 06/05/2005 Document Revised: 06/26/2014 Document Reviewed: 05/02/2013 Elsevier Interactive Patient Education [employer].

## 2015-06-04 NOTE — Progress Notes (Signed)
Pre visit review using our clinic review tool, if applicable. No additional management support is needed unless otherwise documented below in the visit note. 

## 2015-06-05 LAB — HEPATITIS C ANTIBODY: HCV Ab: NEGATIVE

## 2015-06-06 ENCOUNTER — Encounter: Payer: Self-pay | Admitting: Family Medicine

## 2015-06-06 DIAGNOSIS — E870 Hyperosmolality and hypernatremia: Secondary | ICD-10-CM

## 2015-06-06 DIAGNOSIS — E875 Hyperkalemia: Secondary | ICD-10-CM | POA: Insufficient documentation

## 2015-06-06 HISTORY — DX: Hyperosmolality and hypernatremia: E87.0

## 2015-06-06 NOTE — Assessment & Plan Note (Signed)
Good weight loss continue current diet.

## 2015-06-06 NOTE — Assessment & Plan Note (Signed)
Taking nature thyroid with her natural health care provider.

## 2015-06-06 NOTE — Assessment & Plan Note (Signed)
Minimize in diet and recheck cmp

## 2015-06-06 NOTE — Assessment & Plan Note (Signed)
Encouraged heart healthy diet, increase exercise, avoid trans fats, consider a krill oil cap daily. Continues to see [facility] as well

## 2015-06-06 NOTE — Assessment & Plan Note (Signed)
Mild.will repeat cmp

## 2015-06-06 NOTE — Progress Notes (Signed)
Subjective:    Patient ID: Betty Houston, female    DOB: 1965/10/28, 49 y.o.   MRN: [id number]  Chief Complaint  Patient presents with  . Follow-up    HPI Patient is in today for follow-up. She is feeling well. She is happy with her current dose of her nature thyroid she receives [facility]. She reports good weight loss and improvement in her energy levels. She denies any acute concerns. She's had no recent illness. She is taking a herb called Ashwaganda for her stress and finds it helpful. Denies CP/palp/SOB/HA/congestion/fevers/GI or GU c/o. Taking meds as prescribed  Past Medical History  Diagnosis Date  . Depression   . Headache   . Obesity 12/03/2014  . Allergy     food  . Thyroid disease   . Hyperlipidemia   . Preventative health care 12/06/2014  . Hypernatremia 06/06/2015    Past Surgical History  Procedure Laterality Date  . Cesarean section  2001    Family History  Problem Relation Age of Onset  . Heart disease Mother   . Cancer Mother     breast cancer  . Cancer Father     lung cancer, smoker  . Cancer Sister     brain  . Cancer Maternal Aunt     colon cancer  . Arthritis Brother   . Birth defects Brother     missing toe, shorter leg  . Goiter Maternal Grandmother   . Other Sister     ?breast cancer, died in car accident.    Social History   Social History  . Marital Status: Married    Spouse Name: N/A  . Number of Children: N/A  . Years of Education: 16   Occupational History  . Admin. Assistant/Office Manager    Social History Main Topics  . Smoking status: Former Games developer  . Smokeless tobacco: Never Used     Comment: stopped smoking in 2007. patient smoked for approximately 25 years.  . Alcohol Use: 0.0 oz/week    0 Standard drinks or equivalent per week     Comment: rarely maybe a glass of wine  . Drug Use: No  . Sexual Activity: Yes     Comment: lives with husband and son, avoids foods including dairy and gluten, works as an  Chemical engineer,   Other Topics Concern  . Not on file   Social History Narrative    Outpatient Prescriptions Prior to Visit  Medication Sig Dispense Refill  . Ferrous Fumarate (IRON) 18 MG TBCR Take by mouth daily.    . magnesium oxide (MAG-OX) 400 MG tablet Take 400 mg by mouth daily.    . NON FORMULARY Take 75 mg by mouth daily.    . progesterone (PROMETRIUM) 100 MG capsule Take 100 mg by mouth daily.    . Thyroid (NATURE-THROID) 97.5 MG TABS Take 97.5 mg by mouth 2 (two) times daily.    . Vitamin K, Phytonadione, 100 MCG TABS Take 150 tablets by mouth daily.    . ergocalciferol (VITAMIN D2) 50000 UNITS capsule Take 50,000 Units by mouth once a week.     No facility-administered medications prior to visit.    No Known Allergies  Review of Systems  Constitutional: Negative for fever and malaise/fatigue.  HENT: Negative for congestion.   Eyes: Negative for discharge.  Respiratory: Negative for shortness of breath.   Cardiovascular: Negative for chest pain, palpitations, claudication and leg swelling.  Gastrointestinal: Negative for nausea and abdominal pain.  Genitourinary:  Negative for dysuria.  Musculoskeletal: Negative for falls.  Skin: Negative for rash.  Neurological: Negative for loss of consciousness and headaches.  Endo/Heme/Allergies: Negative for environmental allergies.  Psychiatric/Behavioral: Negative for depression. The patient is not nervous/anxious.        Objective:    Physical Exam  Constitutional: She is oriented to person, place, and time. She appears well-developed and well-nourished. No distress.  HENT:  Head: Normocephalic and atraumatic.  Nose: Nose normal.  Eyes: Right eye exhibits no discharge. Left eye exhibits no discharge.  Neck: Normal range of motion. Neck supple.  Cardiovascular: Normal rate and regular rhythm.   No murmur heard. Pulmonary/Chest: Effort normal and breath sounds normal.  Abdominal: Soft. Bowel sounds are normal. There  is no tenderness.  Musculoskeletal: She exhibits no edema.  Neurological: She is alert and oriented to person, place, and time.  Skin: Skin is warm and dry.  Psychiatric: She has a normal mood and affect.  Nursing note and vitals reviewed.   BP 126/78 mmHg  Pulse 79  Temp(Src) 98 F (36.7 C) (Oral)  Ht 5\' 8"  (1.727 m)  Wt 191 lb 6 oz (86.807 kg)  BMI 29.11 kg/m2  SpO2 97% Wt Readings from Last 3 Encounters:  06/04/15 191 lb 6 oz (86.807 kg)  02/26/15 209 lb (94.802 kg)  12/03/14 217 lb 2 oz (98.487 kg)     Lab Results  Component Value Date   GLUCOSE 98 06/04/2015   CHOL 133 06/04/2015   TRIG 155.0* 06/04/2015   HDL 40.90 06/04/2015   LDLCALC 61 06/04/2015   ALT 9 06/04/2015   AST 13 06/04/2015   NA 146* 06/04/2015   K 5.7* 06/04/2015   CL 108 06/04/2015   CREATININE 0.86 06/04/2015   BUN 13 06/04/2015   CO2 32 06/04/2015    No results found for: TSH No results found for: WBC, HGB, HCT, MCV, PLT Lab Results  Component Value Date   NA 146* 06/04/2015   K 5.7* 06/04/2015   CO2 32 06/04/2015   GLUCOSE 98 06/04/2015   BUN 13 06/04/2015   CREATININE 0.86 06/04/2015   BILITOT 0.6 06/04/2015   ALKPHOS 34* 06/04/2015   AST 13 06/04/2015   ALT 9 06/04/2015   PROT 6.6 06/04/2015   ALBUMIN 3.9 06/04/2015   CALCIUM 10.1 06/04/2015   GFR 73.71 06/04/2015   Lab Results  Component Value Date   CHOL 133 06/04/2015   Lab Results  Component Value Date   HDL 40.90 06/04/2015   Lab Results  Component Value Date   LDLCALC 61 06/04/2015   Lab Results  Component Value Date   TRIG 155.0* 06/04/2015   Lab Results  Component Value Date   CHOLHDL 3 06/04/2015   No results found for: HGBA1C     Assessment & Plan:   Problem List Items Addressed This Visit    Hyperkalemia    Minimize in diet and recheck cmp      Hyperlipidemia    Encouraged heart healthy diet, increase exercise, avoid trans fats, consider a krill oil cap daily. Continues to see [facility] as well      Hypernatremia    Mild.will repeat cmp      Obesity    Good weight loss continue current diet.       Thyroid disease    Taking nature thyroid with her natural health care provider.        Other Visit Diagnoses    Encounter for immunization    -  Primary    Hyperlipidemia, mixed        Relevant Orders    Lipid panel (Completed)    Comprehensive metabolic panel (Completed)    Screening for viral disease        Relevant Orders    Hepatitis C antibody (Completed)       I have discontinued Ms. Acton's ergocalciferol. I am also having her maintain her Thyroid, progesterone, magnesium oxide, Vitamin K (Phytonadione), Iron, NON FORMULARY, Cholecalciferol, OVER THE COUNTER MEDICATION, Turmeric, cyanocobalamin, Multiple Vitamins-Minerals (MULTIVITAMIN PO), vitamin C, and NON FORMULARY.  Meds ordered this encounter  Medications  . Cholecalciferol 5000 UNITS capsule    Sig: Take 5,000 Units by mouth daily.  Marland Kitchen OVER THE COUNTER MEDICATION    Sig: Take 1 capsule by mouth 2 (two) times daily. pregnonolone hormone 75 mg two times daily  . Turmeric 500 MG CAPS    Sig: Take by mouth daily.  . cyanocobalamin 1000 MCG tablet    Sig: Take 500 mcg by mouth 2 (two) times daily.  . Multiple Vitamins-Minerals (MULTIVITAMIN PO)    Sig: Take by mouth daily.  . Ascorbic Acid (VITAMIN C) 1000 MG tablet    Sig: Take 1,000 mg by mouth daily.  . NON FORMULARY    Sig: Ashwaghanda 400 mg daily     Danise Edge, MD

## 2015-06-07 ENCOUNTER — Other Ambulatory Visit: Payer: Self-pay | Admitting: Family Medicine

## 2015-06-07 DIAGNOSIS — E875 Hyperkalemia: Secondary | ICD-10-CM

## 2015-06-08 ENCOUNTER — Other Ambulatory Visit (INDEPENDENT_AMBULATORY_CARE_PROVIDER_SITE_OTHER): Payer: BLUE CROSS/BLUE SHIELD

## 2015-06-08 DIAGNOSIS — E875 Hyperkalemia: Secondary | ICD-10-CM

## 2015-06-08 LAB — COMPREHENSIVE METABOLIC PANEL
ALBUMIN: 4 g/dL (ref 3.5–5.2)
ALT: 11 U/L (ref 0–35)
AST: 14 U/L (ref 0–37)
Alkaline Phosphatase: 32 U/L — ABNORMAL LOW (ref 39–117)
BILIRUBIN TOTAL: 0.6 mg/dL (ref 0.2–1.2)
BUN: 14 mg/dL (ref 6–23)
CALCIUM: 9.6 mg/dL (ref 8.4–10.5)
CHLORIDE: 107 meq/L (ref 96–112)
CO2: 26 meq/L (ref 19–32)
Creatinine, Ser: 0.74 mg/dL (ref 0.40–1.20)
GFR: 87.66 mL/min (ref >60.00)
Glucose, Bld: 94 mg/dL (ref 70–99)
Potassium: 3.9 mEq/L (ref 3.5–5.1)
Sodium: 142 mEq/L (ref 135–145)
Total Protein: 6.6 g/dL (ref 6.0–8.3)

## 2015-06-09 ENCOUNTER — Encounter: Payer: Self-pay | Admitting: Family Medicine

## 2015-12-13 ENCOUNTER — Ambulatory Visit (INDEPENDENT_AMBULATORY_CARE_PROVIDER_SITE_OTHER): Payer: Managed Care, Other (non HMO) | Admitting: Family Medicine

## 2015-12-13 ENCOUNTER — Encounter: Payer: Self-pay | Admitting: Family Medicine

## 2015-12-13 VITALS — BP 122/72 | HR 60 | Temp 97.9°F | Ht 68.0 in | Wt 173.4 lb

## 2015-12-13 DIAGNOSIS — T7840XA Allergy, unspecified, initial encounter: Secondary | ICD-10-CM

## 2015-12-13 DIAGNOSIS — E785 Hyperlipidemia, unspecified: Secondary | ICD-10-CM

## 2015-12-13 DIAGNOSIS — E079 Disorder of thyroid, unspecified: Secondary | ICD-10-CM

## 2015-12-13 DIAGNOSIS — Z0000 Encounter for general adult medical examination without abnormal findings: Secondary | ICD-10-CM

## 2015-12-13 DIAGNOSIS — R519 Headache, unspecified: Secondary | ICD-10-CM

## 2015-12-13 DIAGNOSIS — R51 Headache: Secondary | ICD-10-CM

## 2015-12-13 NOTE — Progress Notes (Signed)
Patient ID: Betty Houston, female   DOB: 1966/06/04, 50 y.o.   MRN: [id number]   Subjective:    Patient ID: Betty Houston, female    DOB: [birth year], 50 y.o.   MRN: [id number]  Chief Complaint  Patient presents with  . Annual Exam    HPI Patient is in today for annual exam. Is doing well at this time. Is seeing [facility] and they are arranging her diet and her thyroid. Is happy with weight loss with diet and exercise changes. Denies CP/palp/SOB/HA/congestion/fevers/GI or GU c/o. Taking meds as prescribed.   Past Medical History  Diagnosis Date  . Depression   . Headache   . Obesity 12/03/2014  . Allergy     food  . Thyroid disease   . Hyperlipidemia   . Preventative health care 12/06/2014  . Hypernatremia 06/06/2015    Past Surgical History  Procedure Laterality Date  . Cesarean section  2001    Family History  Problem Relation Age of Onset  . Heart disease Mother   . Cancer Mother     breast cancer  . Cancer Father     lung cancer, smoker  . Cancer Sister     brain  . Cancer Maternal Aunt     colon cancer  . Arthritis Brother   . Birth defects Brother     missing toe, shorter leg  . Goiter Maternal Grandmother   . Other Sister     ?breast cancer, died in car accident.    Social History   Social History  . Marital Status: Married    Spouse Name: N/A  . Number of Children: N/A  . Years of Education: 16   Occupational History  . Admin. Assistant/Office Manager    Social History Main Topics  . Smoking status: Former Games developer  . Smokeless tobacco: Never Used     Comment: stopped smoking in 2007. patient smoked for approximately 25 years.  . Alcohol Use: 0.0 oz/week    0 Standard drinks or equivalent per week     Comment: rarely maybe a glass of wine  . Drug Use: No  . Sexual Activity: Yes     Comment: lives with husband and son, avoids foods including dairy and gluten, works as an Chemical engineer,   Other Topics Concern  . Not on file   Social  History Narrative    Outpatient Prescriptions Prior to Visit  Medication Sig Dispense Refill  . Ascorbic Acid (VITAMIN C) 1000 MG tablet Take 1,000 mg by mouth daily.    . Cholecalciferol 5000 UNITS capsule Take 5,000 Units by mouth daily.    . cyanocobalamin 1000 MCG tablet Take 500 mcg by mouth 2 (two) times daily.    . Ferrous Fumarate (IRON) 18 MG TBCR Take by mouth daily.    . magnesium oxide (MAG-OX) 400 MG tablet Take 400 mg by mouth daily.    . Multiple Vitamins-Minerals (MULTIVITAMIN PO) Take by mouth daily.    . NON FORMULARY Take 75 mg by mouth daily.    . NON FORMULARY Ashwaghanda 400 mg daily    . OVER THE COUNTER MEDICATION Take 1 capsule by mouth 2 (two) times daily. pregnonolone hormone 75 mg two times daily    . progesterone (PROMETRIUM) 100 MG capsule Take 100 mg by mouth daily.    . Thyroid (NATURE-THROID) 97.5 MG TABS Take 97.5 mg by mouth 2 (two) times daily.    . Turmeric 500 MG CAPS Take by mouth daily.    Marland Kitchen  Vitamin K, Phytonadione, 100 MCG TABS Take 150 tablets by mouth daily.     No facility-administered medications prior to visit.    No Known Allergies  Review of Systems  Constitutional: Negative for fever, chills and malaise/fatigue.  HENT: Negative for congestion and hearing loss.   Eyes: Negative for discharge.  Respiratory: Negative for cough, sputum production and shortness of breath.   Cardiovascular: Negative for chest pain, palpitations and leg swelling.  Gastrointestinal: Negative for heartburn, nausea, vomiting, abdominal pain, diarrhea, constipation and blood in stool.  Genitourinary: Negative for dysuria, urgency, frequency and hematuria.  Musculoskeletal: Negative for myalgias, back pain and falls.  Skin: Negative for rash.  Neurological: Negative for dizziness, sensory change, loss of consciousness, weakness and headaches.  Endo/Heme/Allergies: Negative for environmental allergies. Does not bruise/bleed easily.  Psychiatric/Behavioral:  Negative for depression and suicidal ideas. The patient is not nervous/anxious and does not have insomnia.        Objective:    Physical Exam  Constitutional: She is oriented to person, place, and time. She appears well-developed and well-nourished. No distress.  HENT:  Head: Normocephalic and atraumatic.  Eyes: Conjunctivae are normal.  Neck: Neck supple. No thyromegaly present.  Cardiovascular: Normal rate, regular rhythm and normal heart sounds.   No murmur heard. Pulmonary/Chest: Effort normal and breath sounds normal. No respiratory distress.  Abdominal: Soft. Bowel sounds are normal. She exhibits no distension and no mass. There is no tenderness.  Musculoskeletal: She exhibits no edema.  Lymphadenopathy:    She has no cervical adenopathy.  Neurological: She is alert and oriented to person, place, and time.  Skin: Skin is warm and dry.  Psychiatric: She has a normal mood and affect. Her behavior is normal.    BP 108/68 mmHg  Pulse 91  Temp(Src) 97.9 F (36.6 C) (Oral)  Ht 5\' 8"  (1.727 m)  Wt 173 lb 6 oz (78.642 kg)  BMI 26.37 kg/m2  SpO2 95% Wt Readings from Last 3 Encounters:  12/13/15 173 lb 6 oz (78.642 kg)  06/04/15 191 lb 6 oz (86.807 kg)  02/26/15 209 lb (94.802 kg)     Lab Results  Component Value Date   GLUCOSE 94 06/08/2015   CHOL 133 06/04/2015   TRIG 155.0* 06/04/2015   HDL 40.90 06/04/2015   LDLCALC 61 06/04/2015   ALT 11 06/08/2015   AST 14 06/08/2015   NA 142 06/08/2015   K 3.9 06/08/2015   CL 107 06/08/2015   CREATININE 0.74 06/08/2015   BUN 14 06/08/2015   CO2 26 06/08/2015    No results found for: TSH No results found for: WBC, HGB, HCT, MCV, PLT Lab Results  Component Value Date   NA 142 06/08/2015   K 3.9 06/08/2015   CO2 26 06/08/2015   GLUCOSE 94 06/08/2015   BUN 14 06/08/2015   CREATININE 0.74 06/08/2015   BILITOT 0.6 06/08/2015   ALKPHOS 32* 06/08/2015   AST 14 06/08/2015   ALT 11 06/08/2015   PROT 6.6 06/08/2015    ALBUMIN 4.0 06/08/2015   CALCIUM 9.6 06/08/2015   GFR 87.66 06/08/2015   Lab Results  Component Value Date   CHOL 133 06/04/2015   Lab Results  Component Value Date   HDL 40.90 06/04/2015   Lab Results  Component Value Date   LDLCALC 61 06/04/2015   Lab Results  Component Value Date   TRIG 155.0* 06/04/2015   Lab Results  Component Value Date   CHOLHDL 3 06/04/2015   No results found  for: HGBA1C     Assessment & Plan:   Problem List Items Addressed This Visit    Thyroid disease    Her thyroid is being monitored by her alternative practitioner, no recent changes.      Preventative health care - Primary    Patient encouraged to maintain heart healthy diet, regular exercise, adequate sleep. Consider daily probiotics. Take medications as prescribed. Given and reviewed copy of ACP documents from [employer] and encouraged to complete and return      Hyperlipidemia    Encouraged heart healthy diet, increase exercise, avoid trans fats, consider a krill oil cap daily      Headache    Did have a mild issue with increasing progesterone with her alternative practitioner at [facility], Marilynne Halsted, who is unfortunately moving out of the area. Denies CP/palp/SOB/HA/congestion/fevers/GI or GU c/o. Taking meds as prescribed      Allergy    Has increased myalgias with the wrong foods, especially corn and gluten. Could add ginger to diet prn.         I am having Ms. Novello maintain her Thyroid, progesterone, magnesium oxide, Vitamin K (Phytonadione), Iron, NON FORMULARY, Cholecalciferol, OVER THE COUNTER MEDICATION, Turmeric, cyanocobalamin, Multiple Vitamins-Minerals (MULTIVITAMIN PO), vitamin C, and NON FORMULARY.  No orders of the defined types were placed in this encounter.     Danise Edge, MD

## 2015-12-13 NOTE — Assessment & Plan Note (Signed)
Her thyroid is being monitored by her alternative practitioner, no recent changes.

## 2015-12-13 NOTE — Assessment & Plan Note (Signed)
Has increased myalgias with the wrong foods, especially corn and gluten. Could add ginger to diet prn.

## 2015-12-13 NOTE — Assessment & Plan Note (Signed)
Did have a mild issue with increasing progesterone with her alternative practitioner [facility], Marilynne HalstedHaley Neil, who is unfortunately moving out of the area. Denies CP/palp/SOB/HA/congestion/fevers/GI or GU c/o. Taking meds as prescribed

## 2015-12-13 NOTE — Patient Instructions (Signed)
Preventive Care for Adults, Female A healthy lifestyle and preventive care can promote health and wellness. Preventive health guidelines for women include the following key practices.  A routine yearly physical is a good way to check with your health care provider about your health and preventive screening. It is a chance to share any concerns and updates on your health and to receive a thorough exam.  Visit your dentist for a routine exam and preventive care every 6 months. Brush your teeth twice a day and floss once a day. Good oral hygiene prevents tooth decay and gum disease.  The frequency of eye exams is based on your age, health, family medical history, use of contact lenses, and other factors. Follow your health care provider's recommendations for frequency of eye exams.  Eat a healthy diet. Foods like vegetables, fruits, whole grains, low-fat dairy products, and lean protein foods contain the nutrients you need without too many calories. Decrease your intake of foods high in solid fats, added sugars, and salt. Eat the right amount of calories for you.Get information about a proper diet from your health care provider, if necessary.  Regular physical exercise is one of the most important things you can do for your health. Most adults should get at least 150 minutes of moderate-intensity exercise (any activity that increases your heart rate and causes you to sweat) each week. In addition, most adults need muscle-strengthening exercises on 2 or more days a week.  Maintain a healthy weight. The body mass index (BMI) is a screening tool to identify possible weight problems. It provides an estimate of body fat based on height and weight. Your health care provider can find your BMI and can help you achieve or maintain a healthy weight.For adults 20 years and older:  A BMI below 18.5 is considered underweight.  A BMI of 18.5 to 24.9 is normal.  A BMI of 25 to 29.9 is considered overweight.  A  BMI of 30 and above is considered obese.  Maintain normal blood lipids and cholesterol levels by exercising and minimizing your intake of saturated fat. Eat a balanced diet with plenty of fruit and vegetables. Blood tests for lipids and cholesterol should begin at age 45 and be repeated every 5 years. If your lipid or cholesterol levels are high, you are over 50, or you are at high risk for heart disease, you may need your cholesterol levels checked more frequently.Ongoing high lipid and cholesterol levels should be treated with medicines if diet and exercise are not working.  If you smoke, find out from your health care provider how to quit. If you do not use tobacco, do not start.  Lung cancer screening is recommended for adults aged 45-80 years who are at high risk for developing lung cancer because of a history of smoking. A yearly low-dose CT scan of the lungs is recommended for people who have at least a 30-pack-year history of smoking and are a current smoker or have quit within the past 15 years. A pack year of smoking is smoking an average of 1 pack of cigarettes a day for 1 year (for example: 1 pack a day for 30 years or 2 packs a day for 15 years). Yearly screening should continue until the smoker has stopped smoking for at least 15 years. Yearly screening should be stopped for people who develop a health problem that would prevent them from having lung cancer treatment.  If you are pregnant, do not drink alcohol. If you are  breastfeeding, be very cautious about drinking alcohol. If you are not pregnant and choose to drink alcohol, do not have more than 1 drink per day. One drink is considered to be 12 ounces (355 mL) of beer, 5 ounces (148 mL) of wine, or 1.5 ounces (44 mL) of liquor.  Avoid use of street drugs. Do not share needles with anyone. Ask for help if you need support or instructions about stopping the use of drugs.  High blood pressure causes heart disease and increases the risk  of stroke. Your blood pressure should be checked at least every 1 to 2 years. Ongoing high blood pressure should be treated with medicines if weight loss and exercise do not work.  If you are 55-79 years old, ask your health care provider if you should take aspirin to prevent strokes.  Diabetes screening is done by taking a blood sample to check your blood glucose level after you have not eaten for a certain period of time (fasting). If you are not overweight and you do not have risk factors for diabetes, you should be screened once every 3 years starting at age 45. If you are overweight or obese and you are 40-70 years of age, you should be screened for diabetes every year as part of your cardiovascular risk assessment.  Breast cancer screening is essential preventive care for women. You should practice "breast self-awareness." This means understanding the normal appearance and feel of your breasts and may include breast self-examination. Any changes detected, no matter how small, should be reported to a health care provider. Women in their 20s and 30s should have a clinical breast exam (CBE) by a health care provider as part of a regular health exam every 1 to 3 years. After age 40, women should have a CBE every year. Starting at age 40, women should consider having a mammogram (breast X-ray test) every year. Women who have a family history of breast cancer should talk to their health care provider about genetic screening. Women at a high risk of breast cancer should talk to their health care providers about having an MRI and a mammogram every year.  Breast cancer gene (BRCA)-related cancer risk assessment is recommended for women who have family members with BRCA-related cancers. BRCA-related cancers include breast, ovarian, tubal, and peritoneal cancers. Having family members with these cancers may be associated with an increased risk for harmful changes (mutations) in the breast cancer genes BRCA1 and  BRCA2. Results of the assessment will determine the need for genetic counseling and BRCA1 and BRCA2 testing.  Your health care provider may recommend that you be screened regularly for cancer of the pelvic organs (ovaries, uterus, and vagina). This screening involves a pelvic examination, including checking for microscopic changes to the surface of your cervix (Pap test). You may be encouraged to have this screening done every 3 years, beginning at age 21.  For women ages 30-65, health care providers may recommend pelvic exams and Pap testing every 3 years, or they may recommend the Pap and pelvic exam, combined with testing for human papilloma virus (HPV), every 5 years. Some types of HPV increase your risk of cervical cancer. Testing for HPV may also be done on women of any age with unclear Pap test results.  Other health care providers may not recommend any screening for nonpregnant women who are considered low risk for pelvic cancer and who do not have symptoms. Ask your health care provider if a screening pelvic exam is right for   you.  If you have had past treatment for cervical cancer or a condition that could lead to cancer, you need Pap tests and screening for cancer for at least 20 years after your treatment. If Pap tests have been discontinued, your risk factors (such as having a new sexual partner) need to be reassessed to determine if screening should resume. Some women have medical problems that increase the chance of getting cervical cancer. In these cases, your health care provider may recommend more frequent screening and Pap tests.  Colorectal cancer can be detected and often prevented. Most routine colorectal cancer screening begins at the age of 50 years and continues through age 75 years. However, your health care provider may recommend screening at an earlier age if you have risk factors for colon cancer. On a yearly basis, your health care provider may provide home test kits to check  for hidden blood in the stool. Use of a small camera at the end of a tube, to directly examine the colon (sigmoidoscopy or colonoscopy), can detect the earliest forms of colorectal cancer. Talk to your health care provider about this at age 50, when routine screening begins. Direct exam of the colon should be repeated every 5-10 years through age 75 years, unless early forms of precancerous polyps or small growths are found.  People who are at an increased risk for hepatitis B should be screened for this virus. You are considered at high risk for hepatitis B if:  You were born in a country where hepatitis B occurs often. Talk with your health care provider about which countries are considered high risk.  Your parents were born in a high-risk country and you have not received a shot to protect against hepatitis B (hepatitis B vaccine).  You have HIV or AIDS.  You use needles to inject street drugs.  You live with, or have sex with, someone who has hepatitis B.  You get hemodialysis treatment.  You take certain medicines for conditions like cancer, organ transplantation, and autoimmune conditions.  Hepatitis C blood testing is recommended for all people born from 1945 through 1965 and any individual with known risks for hepatitis C.  Practice safe sex. Use condoms and avoid high-risk sexual practices to reduce the spread of sexually transmitted infections (STIs). STIs include gonorrhea, chlamydia, syphilis, trichomonas, herpes, HPV, and human immunodeficiency virus (HIV). Herpes, HIV, and HPV are viral illnesses that have no cure. They can result in disability, cancer, and death.  You should be screened for sexually transmitted illnesses (STIs) including gonorrhea and chlamydia if:  You are sexually active and are younger than 24 years.  You are older than 24 years and your health care provider tells you that you are at risk for this type of infection.  Your sexual activity has changed  since you were last screened and you are at an increased risk for chlamydia or gonorrhea. Ask your health care provider if you are at risk.  If you are at risk of being infected with HIV, it is recommended that you take a prescription medicine daily to prevent HIV infection. This is called preexposure prophylaxis (PrEP). You are considered at risk if:  You are sexually active and do not regularly use condoms or know the HIV status of your partner(s).  You take drugs by injection.  You are sexually active with a partner who has HIV.  Talk with your health care provider about whether you are at high risk of being infected with HIV. If   you choose to begin PrEP, you should first be tested for HIV. You should then be tested every 3 months for as long as you are taking PrEP.  Osteoporosis is a disease in which the bones lose minerals and strength with aging. This can result in serious bone fractures or breaks. The risk of osteoporosis can be identified using a bone density scan. Women ages 67 years and over and women at risk for fractures or osteoporosis should discuss screening with their health care providers. Ask your health care provider whether you should take a calcium supplement or vitamin D to reduce the rate of osteoporosis.  Menopause can be associated with physical symptoms and risks. Hormone replacement therapy is available to decrease symptoms and risks. You should talk to your health care provider about whether hormone replacement therapy is right for you.  Use sunscreen. Apply sunscreen liberally and repeatedly throughout the day. You should seek shade when your shadow is shorter than you. Protect yourself by wearing long sleeves, pants, a wide-brimmed hat, and sunglasses year round, whenever you are outdoors.  Once a month, do a whole body skin exam, using a mirror to look at the skin on your back. Tell your health care provider of new moles, moles that have irregular borders, moles that  are larger than a pencil eraser, or moles that have changed in shape or color.  Stay current with required vaccines (immunizations).  Influenza vaccine. All adults should be immunized every year.  Tetanus, diphtheria, and acellular pertussis (Td, Tdap) vaccine. Pregnant women should receive 1 dose of Tdap vaccine during each pregnancy. The dose should be obtained regardless of the length of time since the last dose. Immunization is preferred during the 27th-36th week of gestation. An adult who has not previously received Tdap or who does not know her vaccine status should receive 1 dose of Tdap. This initial dose should be followed by tetanus and diphtheria toxoids (Td) booster doses every 10 years. Adults with an unknown or incomplete history of completing a 3-dose immunization series with Td-containing vaccines should begin or complete a primary immunization series including a Tdap dose. Adults should receive a Td booster every 10 years.  Varicella vaccine. An adult without evidence of immunity to varicella should receive 2 doses or a second dose if she has previously received 1 dose. Pregnant females who do not have evidence of immunity should receive the first dose after pregnancy. This first dose should be obtained before leaving the health care facility. The second dose should be obtained 4-8 weeks after the first dose.  Human papillomavirus (HPV) vaccine. Females aged 13-26 years who have not received the vaccine previously should obtain the 3-dose series. The vaccine is not recommended for use in pregnant females. However, pregnancy testing is not needed before receiving a dose. If a female is found to be pregnant after receiving a dose, no treatment is needed. In that case, the remaining doses should be delayed until after the pregnancy. Immunization is recommended for any person with an immunocompromised condition through the age of 61 years if she did not get any or all doses earlier. During the  3-dose series, the second dose should be obtained 4-8 weeks after the first dose. The third dose should be obtained 24 weeks after the first dose and 16 weeks after the second dose.  Zoster vaccine. One dose is recommended for adults aged 30 years or older unless certain conditions are present.  Measles, mumps, and rubella (MMR) vaccine. Adults born  before 1957 generally are considered immune to measles and mumps. Adults born in 1957 or later should have 1 or more doses of MMR vaccine unless there is a contraindication to the vaccine or there is laboratory evidence of immunity to each of the three diseases. A routine second dose of MMR vaccine should be obtained at least 28 days after the first dose for students attending postsecondary schools, health care workers, or international travelers. People who received inactivated measles vaccine or an unknown type of measles vaccine during 1963-1967 should receive 2 doses of MMR vaccine. People who received inactivated mumps vaccine or an unknown type of mumps vaccine before 1979 and are at high risk for mumps infection should consider immunization with 2 doses of MMR vaccine. For females of childbearing age, rubella immunity should be determined. If there is no evidence of immunity, females who are not pregnant should be vaccinated. If there is no evidence of immunity, females who are pregnant should delay immunization until after pregnancy. Unvaccinated health care workers born before 1957 who lack laboratory evidence of measles, mumps, or rubella immunity or laboratory confirmation of disease should consider measles and mumps immunization with 2 doses of MMR vaccine or rubella immunization with 1 dose of MMR vaccine.  Pneumococcal 13-valent conjugate (PCV13) vaccine. When indicated, a person who is uncertain of his immunization history and has no record of immunization should receive the PCV13 vaccine. All adults 65 years of age and older should receive this  vaccine. An adult aged 19 years or older who has certain medical conditions and has not been previously immunized should receive 1 dose of PCV13 vaccine. This PCV13 should be followed with a dose of pneumococcal polysaccharide (PPSV23) vaccine. Adults who are at high risk for pneumococcal disease should obtain the PPSV23 vaccine at least 8 weeks after the dose of PCV13 vaccine. Adults older than 52 years of age who have normal immune system function should obtain the PPSV23 vaccine dose at least 1 year after the dose of PCV13 vaccine.  Pneumococcal polysaccharide (PPSV23) vaccine. When PCV13 is also indicated, PCV13 should be obtained first. All adults aged 65 years and older should be immunized. An adult younger than age 65 years who has certain medical conditions should be immunized. Any person who resides in a nursing home or long-term care facility should be immunized. An adult smoker should be immunized. People with an immunocompromised condition and certain other conditions should receive both PCV13 and PPSV23 vaccines. People with human immunodeficiency virus (HIV) infection should be immunized as soon as possible after diagnosis. Immunization during chemotherapy or radiation therapy should be avoided. Routine use of PPSV23 vaccine is not recommended for American Indians, [place] Natives, or people younger than 65 years unless there are medical conditions that require PPSV23 vaccine. When indicated, people who have unknown immunization and have no record of immunization should receive PPSV23 vaccine. One-time revaccination 5 years after the first dose of PPSV23 is recommended for people aged 19-64 years who have chronic kidney failure, nephrotic syndrome, asplenia, or immunocompromised conditions. People who received 1-2 doses of PPSV23 before age 65 years should receive another dose of PPSV23 vaccine at age 65 years or later if at least 5 years have passed since the previous dose. Doses of PPSV23 are not  needed for people immunized with PPSV23 at or after age 65 years.  Meningococcal vaccine. Adults with asplenia or persistent complement component deficiencies should receive 2 doses of quadrivalent meningococcal conjugate (MenACWY-D) vaccine. The doses should be obtained   at least 2 months apart. Microbiologists working with certain meningococcal bacteria, [place] recruits, people at risk during an outbreak, and people who travel to or live in countries with a high rate of meningitis should be immunized. A first-year college student up through age 34 years who is living in a residence hall should receive a dose if she did not receive a dose on or after her 16th birthday. Adults who have certain high-risk conditions should receive one or more doses of vaccine.  Hepatitis A vaccine. Adults who wish to be protected from this disease, have certain high-risk conditions, work with hepatitis A-infected animals, work in hepatitis A research labs, or travel to or work in countries with a high rate of hepatitis A should be immunized. Adults who were previously unvaccinated and who anticipate close contact with an international adoptee during the first 60 days after arrival in the [place] from a country with a high rate of hepatitis A should be immunized.  Hepatitis B vaccine. Adults who wish to be protected from this disease, have certain high-risk conditions, may be exposed to blood or other infectious body fluids, are household contacts or sex partners of hepatitis B positive people, are clients or workers in certain care facilities, or travel to or work in countries with a high rate of hepatitis B should be immunized.  Haemophilus influenzae type b (Hib) vaccine. A previously unvaccinated person with asplenia or sickle cell disease or having a scheduled splenectomy should receive 1 dose of Hib vaccine. Regardless of previous immunization, a recipient of a hematopoietic stem cell transplant should receive a  3-dose series 6-12 months after her successful transplant. Hib vaccine is not recommended for adults with HIV infection. Preventive Services / Frequency Ages 35 to 4 years  Blood pressure check.** / Every 3-5 years.  Lipid and cholesterol check.** / Every 5 years beginning at age 60.  Clinical breast exam.** / Every 3 years for women in their 71s and 10s.  BRCA-related cancer risk assessment.** / For women who have family members with a BRCA-related cancer (breast, ovarian, tubal, or peritoneal cancers).  Pap test.** / Every 2 years from ages 76 through 26. Every 3 years starting at age 61 through age 76 or 93 with a history of 3 consecutive normal Pap tests.  HPV screening.** / Every 3 years from ages 37 through ages 60 to 51 with a history of 3 consecutive normal Pap tests.  Hepatitis C blood test.** / For any individual with known risks for hepatitis C.  Skin self-exam. / Monthly.  Influenza vaccine. / Every year.  Tetanus, diphtheria, and acellular pertussis (Tdap, Td) vaccine.** / Consult your health care provider. Pregnant women should receive 1 dose of Tdap vaccine during each pregnancy. 1 dose of Td every 10 years.  Varicella vaccine.** / Consult your health care provider. Pregnant females who do not have evidence of immunity should receive the first dose after pregnancy.  HPV vaccine. / 3 doses over 6 months, if 93 and younger. The vaccine is not recommended for use in pregnant females. However, pregnancy testing is not needed before receiving a dose.  Measles, mumps, rubella (MMR) vaccine.** / You need at least 1 dose of MMR if you were born in 1957 or later. You may also need a 2nd dose. For females of childbearing age, rubella immunity should be determined. If there is no evidence of immunity, females who are not pregnant should be vaccinated. If there is no evidence of immunity, females who are  pregnant should delay immunization until after pregnancy.  Pneumococcal  13-valent conjugate (PCV13) vaccine.** / Consult your health care provider.  Pneumococcal polysaccharide (PPSV23) vaccine.** / 1 to 2 doses if you smoke cigarettes or if you have certain conditions.  Meningococcal vaccine.** / 1 dose if you are age 68 to 8 years and a Market researcher living in a residence hall, or have one of several medical conditions, you need to get vaccinated against meningococcal disease. You may also need additional booster doses.  Hepatitis A vaccine.** / Consult your health care provider.  Hepatitis B vaccine.** / Consult your health care provider.  Haemophilus influenzae type b (Hib) vaccine.** / Consult your health care provider. Ages 7 to 53 years  Blood pressure check.** / Every year.  Lipid and cholesterol check.** / Every 5 years beginning at age 25 years.  Lung cancer screening. / Every year if you are aged 11-80 years and have a 30-pack-year history of smoking and currently smoke or have quit within the past 15 years. Yearly screening is stopped once you have quit smoking for at least 15 years or develop a health problem that would prevent you from having lung cancer treatment.  Clinical breast exam.** / Every year after age 48 years.  BRCA-related cancer risk assessment.** / For women who have family members with a BRCA-related cancer (breast, ovarian, tubal, or peritoneal cancers).  Mammogram.** / Every year beginning at age 41 years and continuing for as long as you are in good health. Consult with your health care provider.  Pap test.** / Every 3 years starting at age 65 years through age 37 or 70 years with a history of 3 consecutive normal Pap tests.  HPV screening.** / Every 3 years from ages 72 years through ages 60 to 40 years with a history of 3 consecutive normal Pap tests.  Fecal occult blood test (FOBT) of stool. / Every year beginning at age 21 years and continuing until age 5 years. You may not need to do this test if you get  a colonoscopy every 10 years.  Flexible sigmoidoscopy or colonoscopy.** / Every 5 years for a flexible sigmoidoscopy or every 10 years for a colonoscopy beginning at age 35 years and continuing until age 48 years.  Hepatitis C blood test.** / For all people born from [birth year] through 1965 and any individual with known risks for hepatitis C.  Skin self-exam. / Monthly.  Influenza vaccine. / Every year.  Tetanus, diphtheria, and acellular pertussis (Tdap/Td) vaccine.** / Consult your health care provider. Pregnant women should receive 1 dose of Tdap vaccine during each pregnancy. 1 dose of Td every 10 years.  Varicella vaccine.** / Consult your health care provider. Pregnant females who do not have evidence of immunity should receive the first dose after pregnancy.  Zoster vaccine.** / 1 dose for adults aged 30 years or older.  Measles, mumps, rubella (MMR) vaccine.** / You need at least 1 dose of MMR if you were born in 1957 or later. You may also need a second dose. For females of childbearing age, rubella immunity should be determined. If there is no evidence of immunity, females who are not pregnant should be vaccinated. If there is no evidence of immunity, females who are pregnant should delay immunization until after pregnancy.  Pneumococcal 13-valent conjugate (PCV13) vaccine.** / Consult your health care provider.  Pneumococcal polysaccharide (PPSV23) vaccine.** / 1 to 2 doses if you smoke cigarettes or if you have certain conditions.  Meningococcal vaccine.** /  Consult your health care provider.  Hepatitis A vaccine.** / Consult your health care provider.  Hepatitis B vaccine.** / Consult your health care provider.  Haemophilus influenzae type b (Hib) vaccine.** / Consult your health care provider. Ages 64 years and over  Blood pressure check.** / Every year.  Lipid and cholesterol check.** / Every 5 years beginning at age 23 years.  Lung cancer screening. / Every year if you  are aged 16-80 years and have a 30-pack-year history of smoking and currently smoke or have quit within the past 15 years. Yearly screening is stopped once you have quit smoking for at least 15 years or develop a health problem that would prevent you from having lung cancer treatment.  Clinical breast exam.** / Every year after age 74 years.  BRCA-related cancer risk assessment.** / For women who have family members with a BRCA-related cancer (breast, ovarian, tubal, or peritoneal cancers).  Mammogram.** / Every year beginning at age 44 years and continuing for as long as you are in good health. Consult with your health care provider.  Pap test.** / Every 3 years starting at age 58 years through age 22 or 39 years with 3 consecutive normal Pap tests. Testing can be stopped between 65 and 70 years with 3 consecutive normal Pap tests and no abnormal Pap or HPV tests in the past 10 years.  HPV screening.** / Every 3 years from ages 64 years through ages 70 or 61 years with a history of 3 consecutive normal Pap tests. Testing can be stopped between 65 and 70 years with 3 consecutive normal Pap tests and no abnormal Pap or HPV tests in the past 10 years.  Fecal occult blood test (FOBT) of stool. / Every year beginning at age 40 years and continuing until age 27 years. You may not need to do this test if you get a colonoscopy every 10 years.  Flexible sigmoidoscopy or colonoscopy.** / Every 5 years for a flexible sigmoidoscopy or every 10 years for a colonoscopy beginning at age 7 years and continuing until age 32 years.  Hepatitis C blood test.** / For all people born from [birth year] through 1965 and any individual with known risks for hepatitis C.  Osteoporosis screening.** / A one-time screening for women ages 30 years and over and women at risk for fractures or osteoporosis.  Skin self-exam. / Monthly.  Influenza vaccine. / Every year.  Tetanus, diphtheria, and acellular pertussis (Tdap/Td)  vaccine.** / 1 dose of Td every 10 years.  Varicella vaccine.** / Consult your health care provider.  Zoster vaccine.** / 1 dose for adults aged 35 years or older.  Pneumococcal 13-valent conjugate (PCV13) vaccine.** / Consult your health care provider.  Pneumococcal polysaccharide (PPSV23) vaccine.** / 1 dose for all adults aged 46 years and older.  Meningococcal vaccine.** / Consult your health care provider.  Hepatitis A vaccine.** / Consult your health care provider.  Hepatitis B vaccine.** / Consult your health care provider.  Haemophilus influenzae type b (Hib) vaccine.** / Consult your health care provider. ** Family history and personal history of risk and conditions may change your health care provider's recommendations.   This information is not intended to replace advice given to you by your health care provider. Make sure you discuss any questions you have with your health care provider.   Document Released: 08/01/2001 Document Revised: 06/26/2014 Document Reviewed: 10/31/2010 Elsevier Interactive Patient Education [employer].

## 2015-12-13 NOTE — Assessment & Plan Note (Signed)
Encouraged heart healthy diet, increase exercise, avoid trans fats, consider a krill oil cap daily 

## 2015-12-13 NOTE — Progress Notes (Signed)
Pre visit review using our clinic review tool, if applicable. No additional management support is needed unless otherwise documented below in the visit note. 

## 2015-12-13 NOTE — Assessment & Plan Note (Signed)
Patient encouraged to maintain heart healthy diet, regular exercise, adequate sleep. Consider daily probiotics. Take medications as prescribed. Given and reviewed copy of ACP documents from [place] Secretary of State and encouraged to complete and return 

## 2015-12-14 LAB — CBC
HCT: 41.7 % (ref 36.0–46.0)
Hemoglobin: 14.1 g/dL (ref 12.0–15.0)
MCHC: 33.7 g/dL (ref 30.0–36.0)
MCV: 94.3 fl (ref 78.0–100.0)
PLATELETS: 161 10*3/uL (ref 150.0–400.0)
RBC: 4.42 Mil/uL (ref 3.87–5.11)
RDW: 13.2 % (ref 11.5–15.5)
WBC: 7.1 10*3/uL (ref 4.0–10.5)

## 2015-12-14 LAB — COMPREHENSIVE METABOLIC PANEL
ALBUMIN: 3.9 g/dL (ref 3.5–5.2)
ALK PHOS: 36 U/L — AB (ref 39–117)
ALT: 11 U/L (ref 0–35)
AST: 13 U/L (ref 0–37)
BILIRUBIN TOTAL: 0.5 mg/dL (ref 0.2–1.2)
BUN: 13 mg/dL (ref 6–23)
CALCIUM: 9.3 mg/dL (ref 8.4–10.5)
CO2: 30 mEq/L (ref 19–32)
Chloride: 100 mEq/L (ref 96–112)
Creatinine, Ser: 0.72 mg/dL (ref 0.40–1.20)
GFR: 90.29 mL/min (ref >60.00)
Glucose, Bld: 84 mg/dL (ref 70–99)
Potassium: 4.1 mEq/L (ref 3.5–5.1)
Sodium: 140 mEq/L (ref 135–145)
TOTAL PROTEIN: 6.5 g/dL (ref 6.0–8.3)

## 2015-12-14 LAB — LIPID PANEL
CHOLESTEROL: 135 mg/dL (ref 0–200)
HDL: 49.9 mg/dL (ref >39.00)
LDL Cholesterol: 54 mg/dL (ref 0–99)
NonHDL: 85.02
TRIGLYCERIDES: 154 mg/dL — AB (ref 0.0–149.0)
Total CHOL/HDL Ratio: 3
VLDL: 30.8 mg/dL (ref 0.0–40.0)

## 2015-12-16 ENCOUNTER — Encounter: Payer: Self-pay | Admitting: Family Medicine

## 2016-11-28 ENCOUNTER — Telehealth: Payer: Self-pay | Admitting: Family Medicine

## 2016-11-28 NOTE — Telephone Encounter (Signed)
Happy to see her family members please schedule them appointments when able

## 2016-11-29 NOTE — Telephone Encounter (Signed)
Spouse and son scheduled new patient appointments with Dr. Abner GreenspanBlyth.

## 2016-12-14 ENCOUNTER — Ambulatory Visit (INDEPENDENT_AMBULATORY_CARE_PROVIDER_SITE_OTHER): Payer: BLUE CROSS/BLUE SHIELD | Admitting: Family Medicine

## 2016-12-14 ENCOUNTER — Encounter: Payer: Self-pay | Admitting: Family Medicine

## 2016-12-14 DIAGNOSIS — E663 Overweight: Secondary | ICD-10-CM

## 2016-12-14 DIAGNOSIS — E079 Disorder of thyroid, unspecified: Secondary | ICD-10-CM | POA: Diagnosis not present

## 2016-12-14 DIAGNOSIS — E7212 Methylenetetrahydrofolate reductase deficiency: Secondary | ICD-10-CM

## 2016-12-14 DIAGNOSIS — Z1589 Genetic susceptibility to other disease: Secondary | ICD-10-CM

## 2016-12-14 DIAGNOSIS — R7989 Other specified abnormal findings of blood chemistry: Secondary | ICD-10-CM

## 2016-12-14 DIAGNOSIS — E782 Mixed hyperlipidemia: Secondary | ICD-10-CM | POA: Diagnosis not present

## 2016-12-14 DIAGNOSIS — Z0000 Encounter for general adult medical examination without abnormal findings: Secondary | ICD-10-CM

## 2016-12-14 LAB — LIPID PANEL
CHOLESTEROL: 145 mg/dL (ref 0–200)
HDL: 48 mg/dL (ref >39.00)
LDL Cholesterol: 79 mg/dL (ref 0–99)
NonHDL: 97.13
TRIGLYCERIDES: 91 mg/dL (ref 0.0–149.0)
Total CHOL/HDL Ratio: 3
VLDL: 18.2 mg/dL (ref 0.0–40.0)

## 2016-12-14 LAB — COMPREHENSIVE METABOLIC PANEL
ALBUMIN: 4.2 g/dL (ref 3.5–5.2)
ALT: 14 U/L (ref 0–35)
AST: 15 U/L (ref 0–37)
Alkaline Phosphatase: 30 U/L — ABNORMAL LOW (ref 39–117)
BUN: 14 mg/dL (ref 6–23)
CALCIUM: 9.9 mg/dL (ref 8.4–10.5)
CO2: 29 mEq/L (ref 19–32)
Chloride: 103 mEq/L (ref 96–112)
Creatinine, Ser: 0.73 mg/dL (ref 0.40–1.20)
GFR: 88.53 mL/min (ref >60.00)
Glucose, Bld: 97 mg/dL (ref 70–99)
POTASSIUM: 4.1 meq/L (ref 3.5–5.1)
Sodium: 141 mEq/L (ref 135–145)
TOTAL PROTEIN: 6.4 g/dL (ref 6.0–8.3)
Total Bilirubin: 0.5 mg/dL (ref 0.2–1.2)

## 2016-12-14 LAB — CBC
HEMATOCRIT: 42 % (ref 36.0–46.0)
HEMOGLOBIN: 14.5 g/dL (ref 12.0–15.0)
MCHC: 34.5 g/dL (ref 30.0–36.0)
MCV: 93 fl (ref 78.0–100.0)
PLATELETS: 171 10*3/uL (ref 150.0–400.0)
RBC: 4.51 Mil/uL (ref 3.87–5.11)
RDW: 11.9 % (ref 11.5–15.5)
WBC: 5.2 10*3/uL (ref 4.0–10.5)

## 2016-12-14 MED ORDER — PROGESTERONE MICRONIZED 200 MG PO CAPS: 200.0000 mg | ORAL_CAPSULE | Freq: Every day | ORAL | Status: AC

## 2016-12-14 NOTE — Assessment & Plan Note (Signed)
Patient encouraged to maintain heart healthy diet, regular exercise, adequate sleep. Consider daily probiotics. Take medications as prescribed 

## 2016-12-14 NOTE — Assessment & Plan Note (Signed)
Encouraged heart healthy diet, increase exercise, avoid trans fats, consider a krill oil cap daily 

## 2016-12-14 NOTE — Assessment & Plan Note (Signed)
Doing great and has lost a couple pounds since last visit

## 2016-12-14 NOTE — Assessment & Plan Note (Signed)
[facility]

## 2016-12-14 NOTE — Patient Instructions (Addendum)
Call [facility] for your GYN exam    Preventive Care 40-64 Years, Female Preventive care refers to lifestyle choices and visits with your health care provider that can promote health and wellness. What does preventive care include?  A yearly physical exam. This is also called an annual well check.  Dental exams once or twice a year.  Routine eye exams. Ask your health care provider how often you should have your eyes checked.  Personal lifestyle choices, including: ? Daily care of your teeth and gums. ? Regular physical activity. ? Eating a healthy diet. ? Avoiding tobacco and drug use. ? Limiting alcohol use. ? Practicing safe sex. ? Taking low-dose aspirin daily starting at age 63. ? Taking vitamin and mineral supplements as recommended by your health care provider. What happens during an annual well check? The services and screenings done by your health care provider during your annual well check will depend on your age, overall health, lifestyle risk factors, and family history of disease. Counseling Your health care provider may ask you questions about your:  Alcohol use.  Tobacco use.  Drug use.  Emotional well-being.  Home and relationship well-being.  Sexual activity.  Eating habits.  Work and work Statistician.  Method of birth control.  Menstrual cycle.  Pregnancy history.  Screening You may have the following tests or measurements:  Height, weight, and BMI.  Blood pressure.  Lipid and cholesterol levels. These may be checked every 5 years, or more frequently if you are over 41 years old.  Skin check.  Lung cancer screening. You may have this screening every year starting at age 31 if you have a 30-pack-year history of smoking and currently smoke or have quit within the past 15 years.  Fecal occult blood test (FOBT) of the stool. You may have this test every year starting at age 81.  Flexible sigmoidoscopy or colonoscopy. You may  have a sigmoidoscopy every 5 years or a colonoscopy every 10 years starting at age 80.  Hepatitis C blood test.  Hepatitis B blood test.  Sexually transmitted disease (STD) testing.  Diabetes screening. This is done by checking your blood sugar (glucose) after you have not eaten for a while (fasting). You may have this done every 1-3 years.  Mammogram. This may be done every 1-2 years. Talk to your health care provider about when you should start having regular mammograms. This may depend on whether you have a family history of breast cancer.  BRCA-related cancer screening. This may be done if you have a family history of breast, ovarian, tubal, or peritoneal cancers.  Pelvic exam and Pap test. This may be done every 3 years starting at age 57. Starting at age 89, this may be done every 5 years if you have a Pap test in combination with an HPV test.  Bone density scan. This is done to screen for osteoporosis. You may have this scan if you are at high risk for osteoporosis.  Discuss your test results, treatment options, and if necessary, the need for more tests with your health care provider. Vaccines Your health care provider may recommend certain vaccines, such as:  Influenza vaccine. This is recommended every year.  Tetanus, diphtheria, and acellular pertussis (Tdap, Td) vaccine. You may need a Td booster every 10 years.  Varicella vaccine. You may need this if you have not been vaccinated.  Zoster vaccine. You may need this after age 29.  Measles, mumps, and rubella (MMR) vaccine. You may need  at least one dose of MMR if you were born in 1957 or later. You may also need a second dose.  Pneumococcal 13-valent conjugate (PCV13) vaccine. You may need this if you have certain conditions and were not previously vaccinated.  Pneumococcal polysaccharide (PPSV23) vaccine. You may need one or two doses if you smoke cigarettes or if you have certain conditions.  Meningococcal vaccine.  You may need this if you have certain conditions.  Hepatitis A vaccine. You may need this if you have certain conditions or if you travel or work in places where you may be exposed to hepatitis A.  Hepatitis B vaccine. You may need this if you have certain conditions or if you travel or work in places where you may be exposed to hepatitis B.  Haemophilus influenzae type b (Hib) vaccine. You may need this if you have certain conditions.  Talk to your health care provider about which screenings and vaccines you need and how often you need them. This information is not intended to replace advice given to you by your health care provider. Make sure you discuss any questions you have with your health care provider. Document Released: 07/02/2015 Document Revised: 02/23/2016 Document Reviewed: 04/06/2015 Elsevier Interactive Patient Education  2017 [employer].

## 2016-12-17 DIAGNOSIS — Z1589 Genetic susceptibility to other disease: Secondary | ICD-10-CM | POA: Insufficient documentation

## 2016-12-17 DIAGNOSIS — R7989 Other specified abnormal findings of blood chemistry: Secondary | ICD-10-CM | POA: Insufficient documentation

## 2016-12-17 DIAGNOSIS — E7212 Methylenetetrahydrofolate reductase deficiency: Secondary | ICD-10-CM | POA: Insufficient documentation

## 2016-12-17 NOTE — Progress Notes (Signed)
Patient ID: Betty Houston, female   DOB: August 13, 1965, 51 y.o.   MRN: [id number]   Subjective:    Patient ID: Betty Houston, female    DOB: [birth year], 51 y.o.   MRN: [id number]  No chief complaint on file.   HPI Patient is in today for annual preventative exam and follow up on multiple medical problems. She feels well today. She follows with an Integrative medicine practice as well and they are treating her with topical vitamin D and Vitamin B12 and treating her hormone imbalances and she reports she feels better. Denies CP/palp/SOB/HA/congestion/fevers/GI or GU c/o. Taking meds as prescribed  Past Medical History:  Diagnosis Date  . Allergy    food  . Depression   . Headache   . Hyperlipidemia   . Hypernatremia 06/06/2015  . Obesity 12/03/2014  . Overweight 12/03/2014  . Preventative health care 12/06/2014  . Thyroid disease     Past Surgical History:  Procedure Laterality Date  . CESAREAN SECTION  2001    Family History  Problem Relation Age of Onset  . Heart disease Mother   . Cancer Mother        breast cancer  . Cancer Father        lung cancer, smoker  . Cancer Sister        brain  . Cancer Maternal Aunt        colon cancer  . Arthritis Brother   . Birth defects Brother        missing toe, shorter leg  . Gout Brother   . Goiter Maternal Grandmother   . Other Sister        ?breast cancer, died in car accident.    Social History   Social History  . Marital status: Married    Spouse name: N/A  . Number of children: N/A  . Years of education: 70   Occupational History  . Admin. Assistant/Office Manager    Social History Main Topics  . Smoking status: Former Games developer  . Smokeless tobacco: Never Used     Comment: stopped smoking in 2007. patient smoked for approximately 25 years.  . Alcohol use 0.0 oz/week     Comment: rarely maybe a glass of wine  . Drug use: No  . Sexual activity: Yes     Comment: lives with husband and son, avoids foods including dairy and  gluten, works as an Chemical engineer,   Other Topics Concern  . Not on file   Social History Narrative  . No narrative on file    Outpatient Medications Prior to Visit  Medication Sig Dispense Refill  . Ascorbic Acid (VITAMIN C) 1000 MG tablet Take 1,000 mg by mouth daily.    . Cholecalciferol 5000 UNITS capsule Take 5,000 Units by mouth daily.    . cyanocobalamin 1000 MCG tablet Take 500 mcg by mouth 2 (two) times daily.    . Ferrous Fumarate (IRON) 18 MG TBCR Take by mouth daily.    . magnesium oxide (MAG-OX) 400 MG tablet Take 400 mg by mouth daily.    . Multiple Vitamins-Minerals (MULTIVITAMIN PO) Take by mouth daily.    . NON FORMULARY Take 75 mg by mouth daily.    . NON FORMULARY Ashwaghanda 400 mg daily    . OVER THE COUNTER MEDICATION Take 1 capsule by mouth 2 (two) times daily. pregnonolone hormone 75 mg two times daily    . progesterone (PROMETRIUM) 100 MG capsule Take 100 mg by mouth daily.    Marland Kitchen  Thyroid (NATURE-THROID) 97.5 MG TABS Take 97.5 mg by mouth 2 (two) times daily.    . Turmeric 500 MG CAPS Take by mouth daily.    . Vitamin K, Phytonadione, 100 MCG TABS Take 150 tablets by mouth daily.     No facility-administered medications prior to visit.     Allergies  Allergen Reactions  . Corn Starch Swelling  . Dairy Aid  [Lactase] Diarrhea, Nausea Only and Swelling  . Gluten Meal Diarrhea, Nausea Only, Other (See Comments) and Swelling  . Other Swelling    Almonds  . Peanut Oil Rash and Swelling    Review of Systems  Constitutional: Negative for chills, fever and malaise/fatigue.  HENT: Negative for congestion and hearing loss.   Eyes: Negative for discharge.  Respiratory: Negative for cough, sputum production and shortness of breath.   Cardiovascular: Negative for chest pain, palpitations and leg swelling.  Gastrointestinal: Negative for abdominal pain, blood in stool, constipation, diarrhea, heartburn, nausea and vomiting.  Genitourinary: Negative for  dysuria, frequency, hematuria and urgency.  Musculoskeletal: Negative for back pain, falls and myalgias.  Skin: Negative for rash.  Neurological: Negative for dizziness, sensory change, loss of consciousness, weakness and headaches.  Endo/Heme/Allergies: Negative for environmental allergies. Does not bruise/bleed easily.  Psychiatric/Behavioral: Negative for depression and suicidal ideas. The patient is not nervous/anxious and does not have insomnia.        Objective:    Physical Exam  Constitutional: She is oriented to person, place, and time. She appears well-developed and well-nourished. No distress.  HENT:  Head: Normocephalic and atraumatic.  Eyes: Conjunctivae are normal.  Neck: Neck supple. No thyromegaly present.  Cardiovascular: Normal rate, regular rhythm and normal heart sounds.   No murmur heard. Pulmonary/Chest: Effort normal and breath sounds normal. No respiratory distress.  Abdominal: Soft. Bowel sounds are normal. She exhibits no distension and no mass. There is no tenderness.  Musculoskeletal: She exhibits no edema.  Lymphadenopathy:    She has no cervical adenopathy.  Neurological: She is alert and oriented to person, place, and time.  Skin: Skin is warm and dry.  Psychiatric: She has a normal mood and affect. Her behavior is normal.    BP 102/66 (BP Location: Left Arm, Patient Position: Sitting, Cuff Size: Normal)   Pulse 63   Temp 97.5 F (36.4 C) (Oral)   Resp 18   Ht 5\' 8"  (1.727 m)   Wt 168 lb 9.6 oz (76.5 kg)   SpO2 98%   BMI 25.64 kg/m  Wt Readings from Last 3 Encounters:  12/14/16 168 lb 9.6 oz (76.5 kg)  12/13/15 173 lb 6 oz (78.6 kg)  06/04/15 191 lb 6 oz (86.8 kg)     Lab Results  Component Value Date   WBC 5.2 12/14/2016   HGB 14.5 12/14/2016   HCT 42.0 12/14/2016   PLT 171.0 12/14/2016   GLUCOSE 97 12/14/2016   CHOL 145 12/14/2016   TRIG 91.0 12/14/2016   HDL 48.00 12/14/2016   LDLCALC 79 12/14/2016   ALT 14 12/14/2016   AST 15  12/14/2016   NA 141 12/14/2016   K 4.1 12/14/2016   CL 103 12/14/2016   CREATININE 0.73 12/14/2016   BUN 14 12/14/2016   CO2 29 12/14/2016    No results found for: TSH Lab Results  Component Value Date   WBC 5.2 12/14/2016   HGB 14.5 12/14/2016   HCT 42.0 12/14/2016   MCV 93.0 12/14/2016   PLT 171.0 12/14/2016   Lab Results  Component Value Date   NA 141 12/14/2016   K 4.1 12/14/2016   CO2 29 12/14/2016   GLUCOSE 97 12/14/2016   BUN 14 12/14/2016   CREATININE 0.73 12/14/2016   BILITOT 0.5 12/14/2016   ALKPHOS 30 (L) 12/14/2016   AST 15 12/14/2016   ALT 14 12/14/2016   PROT 6.4 12/14/2016   ALBUMIN 4.2 12/14/2016   CALCIUM 9.9 12/14/2016   GFR 88.53 12/14/2016   Lab Results  Component Value Date   CHOL 145 12/14/2016   Lab Results  Component Value Date   HDL 48.00 12/14/2016   Lab Results  Component Value Date   LDLCALC 79 12/14/2016   Lab Results  Component Value Date   TRIG 91.0 12/14/2016   Lab Results  Component Value Date   CHOLHDL 3 12/14/2016   No results found for: HGBA1C     Assessment & Plan:   Problem List Items Addressed This Visit    Thyroid disease    Patient is seeing [facility].       Overweight    Doing great and has lost a couple pounds since last visit      Hyperlipidemia    Encouraged heart healthy diet, increase exercise, avoid trans fats, consider a krill oil cap daily      Relevant Orders   Lipid panel (Completed)   Preventative health care    Patient encouraged to maintain heart healthy diet, regular exercise, adequate sleep. Consider daily probiotics. Take medications as prescribed      Relevant Orders   CBC (Completed)   Comprehensive metabolic panel (Completed)   MTHFR mutation (HCC)   Low serum progesterone    She is following with an Integrative Medicine practitioner who reports her Estrogen and progesterone levels are disregulated and they are treating her with 300 mg of compounded  Progesterone capsules from [facility] pharmacy, she takes a 100 mg and a 200 mg capsules         I am having Ms. Greenhouse start on progesterone. I am also having her maintain her Thyroid, progesterone, magnesium oxide, Vitamin K (Phytonadione), Iron, NON FORMULARY, Cholecalciferol, OVER THE COUNTER MEDICATION, Turmeric, cyanocobalamin, Multiple Vitamins-Minerals (MULTIVITAMIN PO), vitamin C, NON FORMULARY, CoQ-10, and Zinc.  Meds ordered this encounter  Medications  . Coenzyme Q10 (COQ-10) 100 MG CAPS    Sig: Take 100 mg by mouth daily.  . Zinc 50 MG CAPS    Sig: Take 1 tablet by mouth daily.  . progesterone (PROMETRIUM) 200 MG capsule    Sig: Take 1 capsule (200 mg total) by mouth daily.     Danise Edge, MD

## 2016-12-17 NOTE — Assessment & Plan Note (Signed)
She is following with an Integrative Medicine practitioner who reports her Estrogen and progesterone levels are disregulated and they are treating her with 300 mg of comp[facility], she takes a 100 mg and a 200 mg capsules

## 2017-02-15 ENCOUNTER — Other Ambulatory Visit: Payer: Self-pay | Admitting: Obstetrics and Gynecology

## 2017-02-15 DIAGNOSIS — R928 Other abnormal and inconclusive findings on diagnostic imaging of breast: Secondary | ICD-10-CM

## 2017-02-23 ENCOUNTER — Ambulatory Visit
Admission: RE | Admit: 2017-02-23 | Discharge: 2017-02-23 | Disposition: A | Discharge status: Home / Self Care | Payer: BLUE CROSS/BLUE SHIELD | Source: Ambulatory Visit | Attending: Obstetrics and Gynecology | Admitting: Obstetrics and Gynecology

## 2017-02-23 ENCOUNTER — Ambulatory Visit: Payer: BLUE CROSS/BLUE SHIELD

## 2017-02-23 DIAGNOSIS — R928 Other abnormal and inconclusive findings on diagnostic imaging of breast: Secondary | ICD-10-CM

## 2017-12-24 ENCOUNTER — Encounter: Payer: Self-pay | Admitting: Family Medicine

## 2017-12-24 ENCOUNTER — Ambulatory Visit (INDEPENDENT_AMBULATORY_CARE_PROVIDER_SITE_OTHER): Payer: BLUE CROSS/BLUE SHIELD | Admitting: Family Medicine

## 2017-12-24 VITALS — BP 122/68 | HR 66 | Temp 98.1°F | Resp 18 | Ht 68.0 in | Wt 199.8 lb

## 2017-12-24 DIAGNOSIS — E079 Disorder of thyroid, unspecified: Secondary | ICD-10-CM

## 2017-12-24 DIAGNOSIS — R7989 Other specified abnormal findings of blood chemistry: Secondary | ICD-10-CM

## 2017-12-24 DIAGNOSIS — E875 Hyperkalemia: Secondary | ICD-10-CM | POA: Diagnosis not present

## 2017-12-24 DIAGNOSIS — Z0000 Encounter for general adult medical examination without abnormal findings: Secondary | ICD-10-CM | POA: Diagnosis not present

## 2017-12-24 DIAGNOSIS — L578 Other skin changes due to chronic exposure to nonionizing radiation: Secondary | ICD-10-CM | POA: Diagnosis not present

## 2017-12-24 DIAGNOSIS — E782 Mixed hyperlipidemia: Secondary | ICD-10-CM

## 2017-12-24 LAB — COMPREHENSIVE METABOLIC PANEL
ALT: 12 U/L (ref 0–35)
AST: 13 U/L (ref 0–37)
Albumin: 3.9 g/dL (ref 3.5–5.2)
Alkaline Phosphatase: 40 U/L (ref 39–117)
BILIRUBIN TOTAL: 0.5 mg/dL (ref 0.2–1.2)
BUN: 16 mg/dL (ref 6–23)
CALCIUM: 9.5 mg/dL (ref 8.4–10.5)
CHLORIDE: 104 meq/L (ref 96–112)
CO2: 30 meq/L (ref 19–32)
Creatinine, Ser: 0.78 mg/dL (ref 0.40–1.20)
GFR: 81.69 mL/min (ref >60.00)
Glucose, Bld: 101 mg/dL — ABNORMAL HIGH (ref 70–99)
POTASSIUM: 5.2 meq/L — AB (ref 3.5–5.1)
Sodium: 139 mEq/L (ref 135–145)
Total Protein: 6.1 g/dL (ref 6.0–8.3)

## 2017-12-24 LAB — CBC
HEMATOCRIT: 42.4 % (ref 36.0–46.0)
HEMOGLOBIN: 14.4 g/dL (ref 12.0–15.0)
MCHC: 33.9 g/dL (ref 30.0–36.0)
MCV: 93.7 fl (ref 78.0–100.0)
PLATELETS: 175 10*3/uL (ref 150.0–400.0)
RBC: 4.53 Mil/uL (ref 3.87–5.11)
RDW: 13.1 % (ref 11.5–15.5)
WBC: 6.3 10*3/uL (ref 4.0–10.5)

## 2017-12-24 LAB — LIPID PANEL
CHOL/HDL RATIO: 3
Cholesterol: 178 mg/dL (ref 0–200)
HDL: 60.2 mg/dL (ref >39.00)
LDL Cholesterol: 87 mg/dL (ref 0–99)
NonHDL: 118.2
TRIGLYCERIDES: 154 mg/dL — AB (ref 0.0–149.0)
VLDL: 30.8 mg/dL (ref 0.0–40.0)

## 2017-12-24 LAB — TSH: TSH: <0.01 u[IU]/mL — ABNORMAL LOW (ref 0.35–4.50)

## 2017-12-24 NOTE — Assessment & Plan Note (Addendum)
[facility] and they have been supplementing progesterone. They have started her on .25 mg estradiol as well.

## 2017-12-24 NOTE — Progress Notes (Signed)
Subjective:  I acted as a Neurosurgeon for Dr. Abner Greenspan. [place], [place]  Patient ID: Betty Houston, female    DOB: Dec 08, 1965, 52 y.o.   MRN: [id number]  No chief complaint on file.   HPI  Patient is in today for annual exam and follow up on chronic medical concerns including hyperlipidemia, allergies and more. No recent febrile illness or hospitalizations. She is following with [facility] and they manage her hormones. They have recently added some estrogen and that has been some helpful. She notes some new lesions on her left leg the last few months she is concerned about. They are not changing and there was not trauma. They just will not resolve.  Patient Care Team: Bradd Canary, MD as PCP - General (Family Medicine)   Past Medical History:  Diagnosis Date  . Allergy    food  . Depression   . Headache   . Hyperlipidemia   . Hypernatremia 06/06/2015  . Obesity 12/03/2014  . Overweight 12/03/2014  . Preventative health care 12/06/2014  . Thyroid disease     Past Surgical History:  Procedure Laterality Date  . CESAREAN SECTION  2001    Family History  Problem Relation Age of Onset  . Heart disease Mother   . Cancer Mother        breast cancer  . Cancer Father        lung cancer, smoker  . Cancer Sister        brain  . Cancer Maternal Aunt        colon cancer  . Arthritis Brother   . Birth defects Brother        missing toe, shorter leg  . Gout Brother   . Goiter Maternal Grandmother   . Other Sister        ?breast cancer, died in car accident.    Social History   Socioeconomic History  . Marital status: Married    Spouse name: Not on file  . Number of children: Not on file  . Years of education: 24  . Highest education level: Not on file  Occupational History  . Occupation: Admin. Assistant/Office Manager  Social Needs  . Financial resource strain: Not on file  . Food insecurity:    Worry: Not on file    Inability: Not on file  . Transportation  needs:    Medical: Not on file    Non-medical: Not on file  Tobacco Use  . Smoking status: Former Games developer  . Smokeless tobacco: Never Used  . Tobacco comment: stopped smoking in 2007. patient smoked for approximately 25 years.  Substance and Sexual Activity  . Alcohol use: Yes    Alcohol/week: 0.0 oz    Comment: rarely maybe a glass of wine  . Drug use: No  . Sexual activity: Yes    Comment: lives with husband and son, avoids foods including dairy and gluten, works as an Chemical engineer,  Lifestyle  . Physical activity:    Days per week: Not on file    Minutes per session: Not on file  . Stress: Not on file  Relationships  . Social connections:    Talks on phone: Not on file    Gets together: Not on file    Attends religious service: Not on file    Active member of club or organization: Not on file    Attends meetings of clubs or organizations: Not on file    Relationship status: Not on file  .  Intimate partner violence:    Fear of current or ex partner: Not on file    Emotionally abused: Not on file    Physically abused: Not on file    Forced sexual activity: Not on file  Other Topics Concern  . Not on file  Social History Narrative  . Not on file    Outpatient Medications Prior to Visit  Medication Sig Dispense Refill  . Ascorbic Acid (VITAMIN C) 1000 MG tablet Take 1,000 mg by mouth daily.    . Cholecalciferol 5000 UNITS capsule Take 5,000 Units by mouth daily.    . cyanocobalamin 1000 MCG tablet Take 500 mcg by mouth 2 (two) times daily.    . Ferrous Fumarate (IRON) 18 MG TBCR Take by mouth daily.    . magnesium oxide (MAG-OX) 400 MG tablet Take 400 mg by mouth daily.    . Misc Natural Products (ADRENAL PO) Take by mouth.    . Multiple Vitamins-Minerals (MULTIVITAMIN PO) Take by mouth daily.    . NON FORMULARY Take 75 mg by mouth daily.    . NON FORMULARY Ashwaghanda 400 mg daily    . OVER THE COUNTER MEDICATION Take 1 capsule by mouth 5 (five) times daily as  needed (Weekly). pregnonolone hormone 75 mg two times daily     . progesterone (PROMETRIUM) 100 MG capsule Take 100 mg by mouth daily.    . progesterone (PROMETRIUM) 200 MG capsule Take 1 capsule (200 mg total) by mouth daily.    . Thyroid (NATURE-THROID) 97.5 MG TABS Take 113.75 mg by mouth 2 (two) times daily.     . Turmeric 500 MG CAPS Take by mouth daily.    . Vitamin K, Phytonadione, 100 MCG TABS Take 150 tablets by mouth daily.    . Zinc 50 MG CAPS Take 1 tablet by mouth daily.    . Coenzyme Q10 (COQ-10) 100 MG CAPS Take 100 mg by mouth daily.    Marland Kitchen estradiol (CLIMARA - DOSED IN MG/24 HR) 0.025 mg/24hr patch Place 1 patch (0.025 mg total) onto the skin once a week. 4 patch 12   No facility-administered medications prior to visit.     Allergies  Allergen Reactions  . Corn Starch Swelling  . Dairy Aid  [Lactase] Diarrhea, Nausea Only and Swelling  . Gluten Meal Diarrhea, Nausea Only, Other (See Comments) and Swelling  . Other Swelling    Almonds  . Peanut Oil Rash and Swelling    Review of Systems  Constitutional: Negative for chills, fever and malaise/fatigue.  HENT: Negative for congestion and hearing loss.   Eyes: Negative for discharge.  Respiratory: Negative for cough, sputum production and shortness of breath.   Cardiovascular: Negative for chest pain, palpitations and leg swelling.  Gastrointestinal: Negative for abdominal pain, blood in stool, constipation, diarrhea, heartburn, nausea and vomiting.  Genitourinary: Negative for dysuria, frequency, hematuria and urgency.  Musculoskeletal: Negative for back pain, falls and myalgias.  Skin: Negative for rash.  Neurological: Negative for dizziness, sensory change, loss of consciousness, weakness and headaches.  Endo/Heme/Allergies: Negative for environmental allergies. Does not bruise/bleed easily.  Psychiatric/Behavioral: Negative for depression and suicidal ideas. The patient is not nervous/anxious and does not have  insomnia.        Objective:    Physical Exam  Constitutional: She is oriented to person, place, and time. She appears well-developed and well-nourished. No distress.  HENT:  Head: Normocephalic and atraumatic.  Eyes: Conjunctivae are normal.  Neck: Neck supple. No thyromegaly present.  Cardiovascular: Normal rate, regular rhythm and normal heart sounds.  No murmur heard. Pulmonary/Chest: Effort normal and breath sounds normal. No respiratory distress.  Abdominal: Soft. Bowel sounds are normal. She exhibits no distension and no mass. There is no tenderness.  Musculoskeletal: She exhibits no edema.  Lymphadenopathy:    She has no cervical adenopathy.  Neurological: She is alert and oriented to person, place, and time.  Skin: Skin is warm and dry.  Lesions on anterior tibial plateau left leg raised pearly and mildly erythematous 1 cm in diameter  Psychiatric: She has a normal mood and affect. Her behavior is normal.    BP 122/68 (BP Location: Left Arm, Patient Position: Sitting, Cuff Size: Normal)   Pulse 66   Temp 98.1 F (36.7 C) (Oral)   Resp 18   Ht 5\' 8"  (1.727 m)   Wt 199 lb 12.8 oz (90.6 kg)   SpO2 99%   BMI 30.38 kg/m  Wt Readings from Last 3 Encounters:  12/24/17 199 lb 12.8 oz (90.6 kg)  12/14/16 168 lb 9.6 oz (76.5 kg)  12/13/15 173 lb 6 oz (78.6 kg)   BP Readings from Last 3 Encounters:  12/24/17 122/68  12/14/16 102/66  12/13/15 122/72     Immunization History  Administered Date(s) Administered  . Influenza,inj,Quad PF,6+ Mos 06/04/2015    Health Maintenance  Topic Date Due  . HIV Screening  08/18/1978  . TETANUS/TDAP  08/18/1982  . PAP SMEAR  04/25/2016  . MAMMOGRAM  02/24/2017  . INFLUENZA VACCINE  01/17/2018  . COLONOSCOPY  03/04/2024  . Hepatitis C Screening  Completed    Lab Results  Component Value Date   WBC 6.3 12/24/2017   HGB 14.4 12/24/2017   HCT 42.4 12/24/2017   PLT 175.0 12/24/2017   GLUCOSE 101 (H) 12/24/2017   CHOL 178  12/24/2017   TRIG 154.0 (H) 12/24/2017   HDL 60.20 12/24/2017   LDLCALC 87 12/24/2017   ALT 12 12/24/2017   AST 13 12/24/2017   NA 139 12/24/2017   K 5.2 (H) 12/24/2017   CL 104 12/24/2017   CREATININE 0.78 12/24/2017   BUN 16 12/24/2017   CO2 30 12/24/2017   TSH <0.01 Repeated and verified X2. (L) 12/24/2017    Lab Results  Component Value Date   TSH <0.01 Repeated and verified X2. (L) 12/24/2017   Lab Results  Component Value Date   WBC 6.3 12/24/2017   HGB 14.4 12/24/2017   HCT 42.4 12/24/2017   MCV 93.7 12/24/2017   PLT 175.0 12/24/2017   Lab Results  Component Value Date   NA 139 12/24/2017   K 5.2 (H) 12/24/2017   CO2 30 12/24/2017   GLUCOSE 101 (H) 12/24/2017   BUN 16 12/24/2017   CREATININE 0.78 12/24/2017   BILITOT 0.5 12/24/2017   ALKPHOS 40 12/24/2017   AST 13 12/24/2017   ALT 12 12/24/2017   PROT 6.1 12/24/2017   ALBUMIN 3.9 12/24/2017   CALCIUM 9.5 12/24/2017   GFR 81.69 12/24/2017   Lab Results  Component Value Date   CHOL 178 12/24/2017   Lab Results  Component Value Date   HDL 60.20 12/24/2017   Lab Results  Component Value Date   LDLCALC 87 12/24/2017   Lab Results  Component Value Date   TRIG 154.0 (H) 12/24/2017   Lab Results  Component Value Date   CHOLHDL 3 12/24/2017   No results found for: HGBA1C       Assessment & Plan:   Problem  List Items Addressed This Visit    Thyroid disease    TSH suppressed encouraged to decrease thyroid tabs by one daily and reassess at next visit. asymptomatic      Hyperlipidemia    Encouraged heart healthy diet, increase exercise, avoid trans fats, consider a krill oil cap daily      Relevant Orders   Lipid panel (Completed)   Preventative health care    Patient encouraged to maintain heart healthy diet, regular exercise, adequate sleep. Consider daily probiotics. Take medications as prescribed. Labs ordered. Given and reviewed copy of ACP documents from [employer] and  encouraged to complete and return      Relevant Orders   CBC (Completed)   Comprehensive metabolic panel (Completed)   TSH (Completed)   Hyperkalemia    Mild asymptomatic will have her minimize in diet and monitor      Low serum progesterone    Is following with [facility] and they have been supplementing progesterone. They have started her on .25 mg estradiol as well.       Other Visit Diagnoses    Sun-damaged skin    -  Primary   Relevant Orders   Ambulatory referral to Dermatology      I am having Ladelle L. Catoe maintain her Thyroid, progesterone, magnesium oxide, Vitamin K (Phytonadione), Iron, NON FORMULARY, Cholecalciferol, OVER THE COUNTER MEDICATION, Turmeric, cyanocobalamin, Multiple Vitamins-Minerals (MULTIVITAMIN PO), vitamin C, NON FORMULARY, CoQ-10, Zinc, progesterone, Misc Natural Products (ADRENAL PO), and estradiol.  No orders of the defined types were placed in this encounter.   CMA served as Neurosurgeon during this visit. History, Physical and Plan performed by medical provider. Documentation and orders reviewed and attested to.  Danise Edge, MD

## 2017-12-24 NOTE — Assessment & Plan Note (Signed)
Encouraged heart healthy diet, increase exercise, avoid trans fats, consider a krill oil cap daily 

## 2017-12-24 NOTE — Patient Instructions (Signed)
Shingrix is the new shingles shot 2 shots over 2-6 months, insurance can confirm payement Preventive Care 40-64 Years, Female Preventive care refers to lifestyle choices and visits with your health care provider that can promote health and wellness. What does preventive care include?  A yearly physical exam. This is also called an annual well check.  Dental exams once or twice a year.  Routine eye exams. Ask your health care provider how often you should have your eyes checked.  Personal lifestyle choices, including: ? Daily care of your teeth and gums. ? Regular physical activity. ? Eating a healthy diet. ? Avoiding tobacco and drug use. ? Limiting alcohol use. ? Practicing safe sex. ? Taking low-dose aspirin daily starting at age 32. ? Taking vitamin and mineral supplements as recommended by your health care provider. What happens during an annual well check? The services and screenings done by your health care provider during your annual well check will depend on your age, overall health, lifestyle risk factors, and family history of disease. Counseling Your health care provider may ask you questions about your:  Alcohol use.  Tobacco use.  Drug use.  Emotional well-being.  Home and relationship well-being.  Sexual activity.  Eating habits.  Work and work Statistician.  Method of birth control.  Menstrual cycle.  Pregnancy history.  Screening You may have the following tests or measurements:  Height, weight, and BMI.  Blood pressure.  Lipid and cholesterol levels. These may be checked every 5 years, or more frequently if you are over 50 years old.  Skin check.  Lung cancer screening. You may have this screening every year starting at age 71 if you have a 30-pack-year history of smoking and currently smoke or have quit within the past 15 years.  Fecal occult blood test (FOBT) of the stool. You may have this test every year starting at age 60.  Flexible  sigmoidoscopy or colonoscopy. You may have a sigmoidoscopy every 5 years or a colonoscopy every 10 years starting at age 69.  Hepatitis C blood test.  Hepatitis B blood test.  Sexually transmitted disease (STD) testing.  Diabetes screening. This is done by checking your blood sugar (glucose) after you have not eaten for a while (fasting). You may have this done every 1-3 years.  Mammogram. This may be done every 1-2 years. Talk to your health care provider about when you should start having regular mammograms. This may depend on whether you have a family history of breast cancer.  BRCA-related cancer screening. This may be done if you have a family history of breast, ovarian, tubal, or peritoneal cancers.  Pelvic exam and Pap test. This may be done every 3 years starting at age 39. Starting at age 22, this may be done every 5 years if you have a Pap test in combination with an HPV test.  Bone density scan. This is done to screen for osteoporosis. You may have this scan if you are at high risk for osteoporosis.  Discuss your test results, treatment options, and if necessary, the need for more tests with your health care provider. Vaccines Your health care provider may recommend certain vaccines, such as:  Influenza vaccine. This is recommended every year.  Tetanus, diphtheria, and acellular pertussis (Tdap, Td) vaccine. You may need a Td booster every 10 years.  Varicella vaccine. You may need this if you have not been vaccinated.  Zoster vaccine. You may need this after age 24.  Measles, mumps, and rubella (MMR)  vaccine. You may need at least one dose of MMR if you were born in 1957 or later. You may also need a second dose.  Pneumococcal 13-valent conjugate (PCV13) vaccine. You may need this if you have certain conditions and were not previously vaccinated.  Pneumococcal polysaccharide (PPSV23) vaccine. You may need one or two doses if you smoke cigarettes or if you have certain  conditions.  Meningococcal vaccine. You may need this if you have certain conditions.  Hepatitis A vaccine. You may need this if you have certain conditions or if you travel or work in places where you may be exposed to hepatitis A.  Hepatitis B vaccine. You may need this if you have certain conditions or if you travel or work in places where you may be exposed to hepatitis B.  Haemophilus influenzae type b (Hib) vaccine. You may need this if you have certain conditions.  Talk to your health care provider about which screenings and vaccines you need and how often you need them. This information is not intended to replace advice given to you by your health care provider. Make sure you discuss any questions you have with your health care provider. Document Released: 07/02/2015 Document Revised: 02/23/2016 Document Reviewed: 04/06/2015 Elsevier Interactive Patient Education  [employer].

## 2017-12-24 NOTE — Assessment & Plan Note (Signed)
Patient encouraged to maintain heart healthy diet, regular exercise, adequate sleep. Consider daily probiotics. Take medications as prescribed. Labs ordered. Given and reviewed copy of ACP documents from [place] Secretary of State and encouraged to complete and return. 

## 2017-12-25 DIAGNOSIS — L578 Other skin changes due to chronic exposure to nonionizing radiation: Secondary | ICD-10-CM | POA: Insufficient documentation

## 2017-12-25 NOTE — Assessment & Plan Note (Signed)
Mild asymptomatic will have her minimize in diet and monitor

## 2017-12-25 NOTE — Assessment & Plan Note (Signed)
TSH suppressed encouraged to decrease thyroid tabs by one daily and reassess at next visit. asymptomatic

## 2017-12-25 NOTE — Assessment & Plan Note (Signed)
Referred to dermatology for this and lesions on left lower leg

## 2018-01-04 ENCOUNTER — Telehealth: Payer: Self-pay | Admitting: *Deleted

## 2018-01-04 NOTE — Telephone Encounter (Signed)
Received Medical records from [facility]; forwarded to provider/SLS 07/19

## 2018-02-25 ENCOUNTER — Other Ambulatory Visit: Payer: Self-pay | Admitting: Obstetrics and Gynecology

## 2018-02-25 DIAGNOSIS — R928 Other abnormal and inconclusive findings on diagnostic imaging of breast: Secondary | ICD-10-CM

## 2018-03-01 ENCOUNTER — Ambulatory Visit
Admission: RE | Admit: 2018-03-01 | Discharge: 2018-03-01 | Disposition: A | Discharge status: Home / Self Care | Payer: BLUE CROSS/BLUE SHIELD | Source: Ambulatory Visit | Attending: Obstetrics and Gynecology | Admitting: Obstetrics and Gynecology

## 2018-03-01 DIAGNOSIS — R928 Other abnormal and inconclusive findings on diagnostic imaging of breast: Secondary | ICD-10-CM

## 2018-03-05 ENCOUNTER — Telehealth: Payer: Self-pay

## 2018-03-05 NOTE — Telephone Encounter (Signed)
Copied from CRM [id number]. Topic: General - Other >> Mar 04, 2018 11:49 AM Betty Houston, Betty Houston wrote: Reason for CRM:   Patient would like to talk to someone about her 12/24/17 office visit.  She stated she had a physical but it was not coded as a physical, so her insurance company is not paying for it.  Patient said the office visit will need to be re-coded.    Spoke with patient she stated she got 2 visit charges on the same day. She wants to know what is the purpose of the yearly exam,

## 2018-03-05 NOTE — Telephone Encounter (Signed)
I evieweed her chart and she has numerous medical conditions that have to be monitored and managed so she has to be seen for these at least once a year and documented or I am cannot write prescriptions or monitor them.  If I do not monitor them it is neglect. If I monitor them and do not document and manage them then I am subject to complaints of medicare fraud. I am happy to see her on 2 different days next year if she chooses to return. She also had a high potassium and a very low TSH so they need to be monitored and she should do lab work next month. I am willing to monitor and repeat lab work for her without being seen which will mean I do it without getting reimbursed but I have to be reimbursed at least once a year to manage things so we can keep the office open.

## 2018-03-06 ENCOUNTER — Encounter: Payer: Self-pay | Admitting: Family Medicine

## 2018-03-07 NOTE — Telephone Encounter (Signed)
Re-routed to [place] to address coding/billing concern of pt.

## 2018-03-19 IMAGING — MG 2D DIGITAL DIAGNOSTIC UNILATERAL LEFT MAMMOGRAM WITH CAD AND ADJ
6 series · 6 of 14 positions shown · non-contrast
Comparison: February 12, 2017 and earlier priors

CLINICAL DATA: Possible mass left breast identified on recent
screening mammogram.

EXAM:
2D DIGITAL DIAGNOSTIC UNILATERAL LEFT MAMMOGRAM WITH CAD AND ADJUNCT
TOMO

[L MLO]
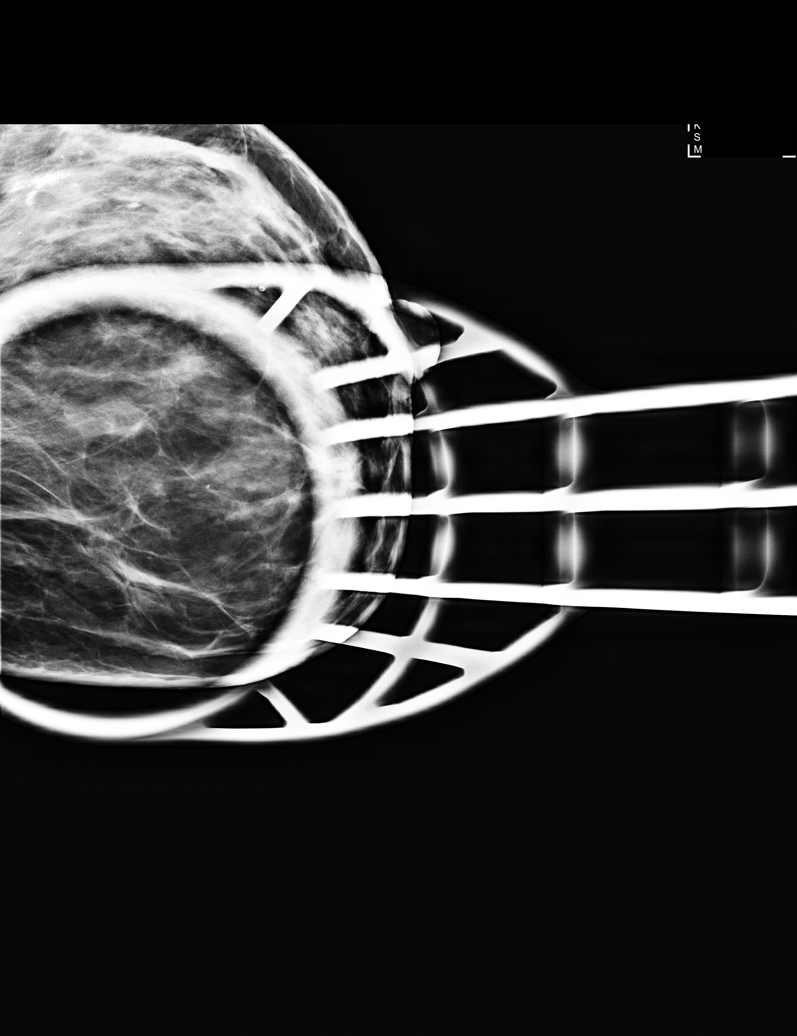

[L MLO synth-2D]
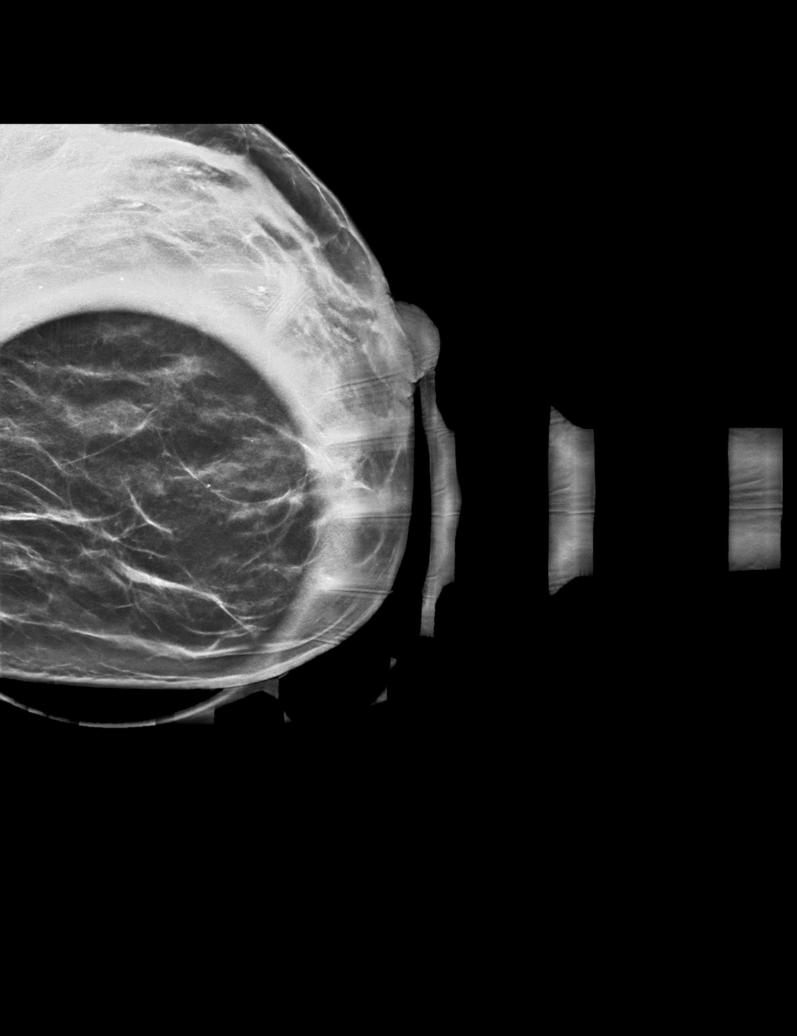

[L CC synth-2D]
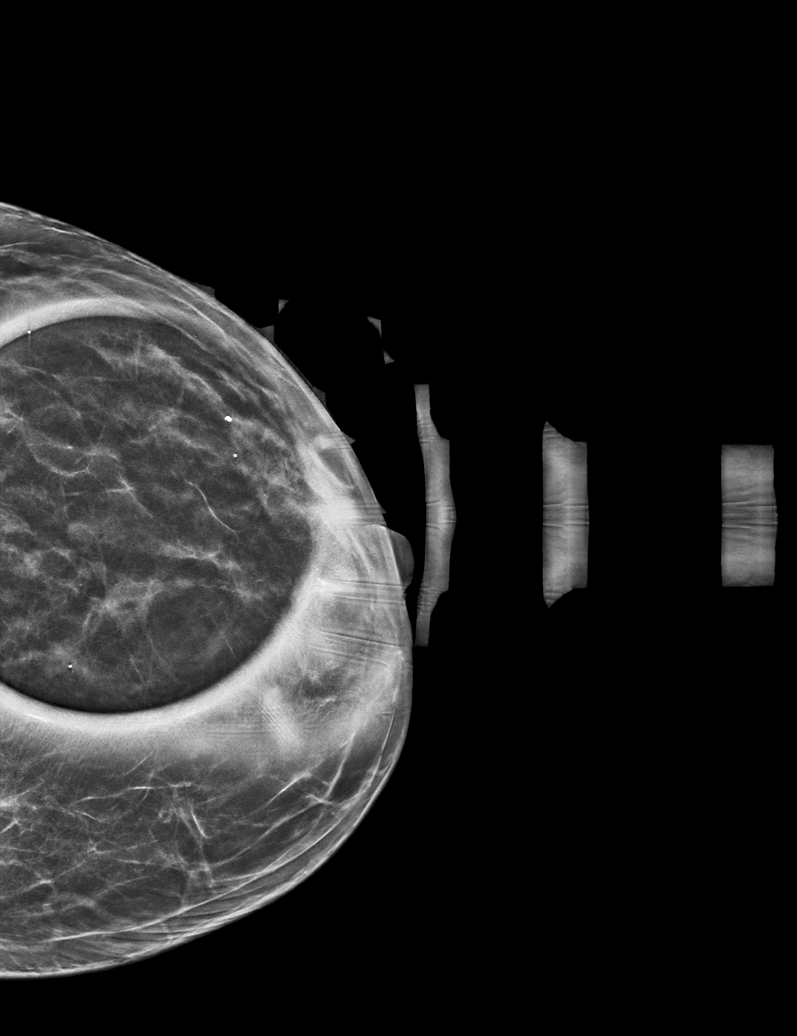

[L CC]
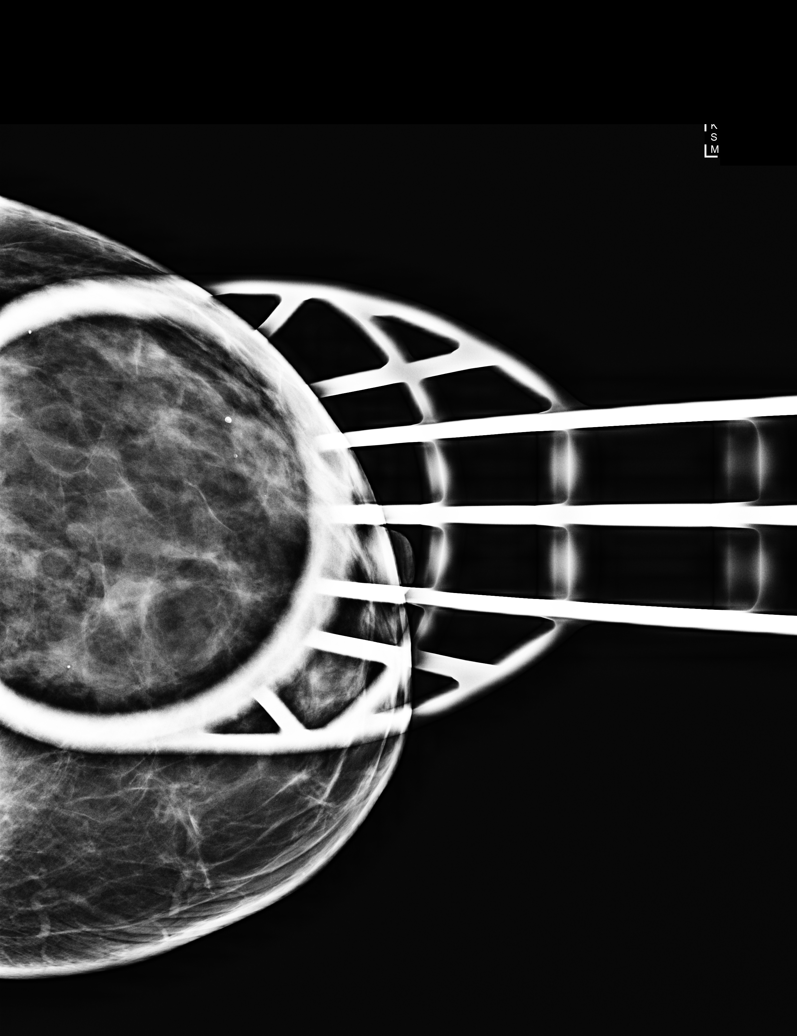

[L CC tomo · tomo slice 23/46.0]
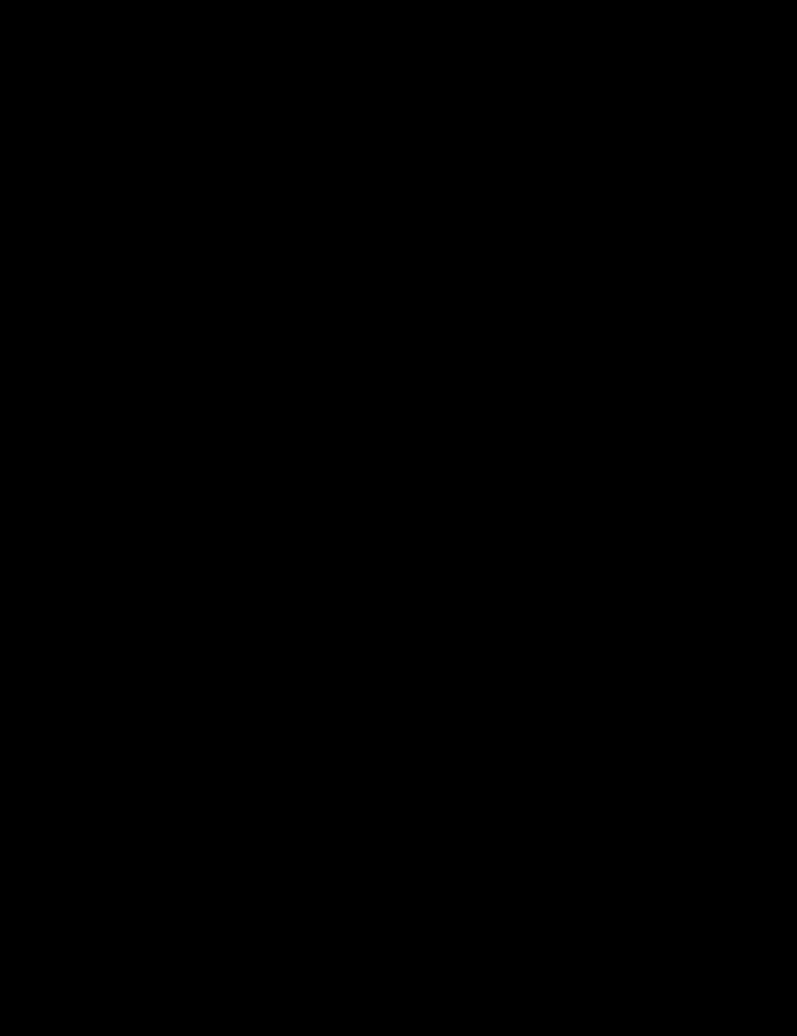

[L MLO tomo · tomo slice 25/50.0]
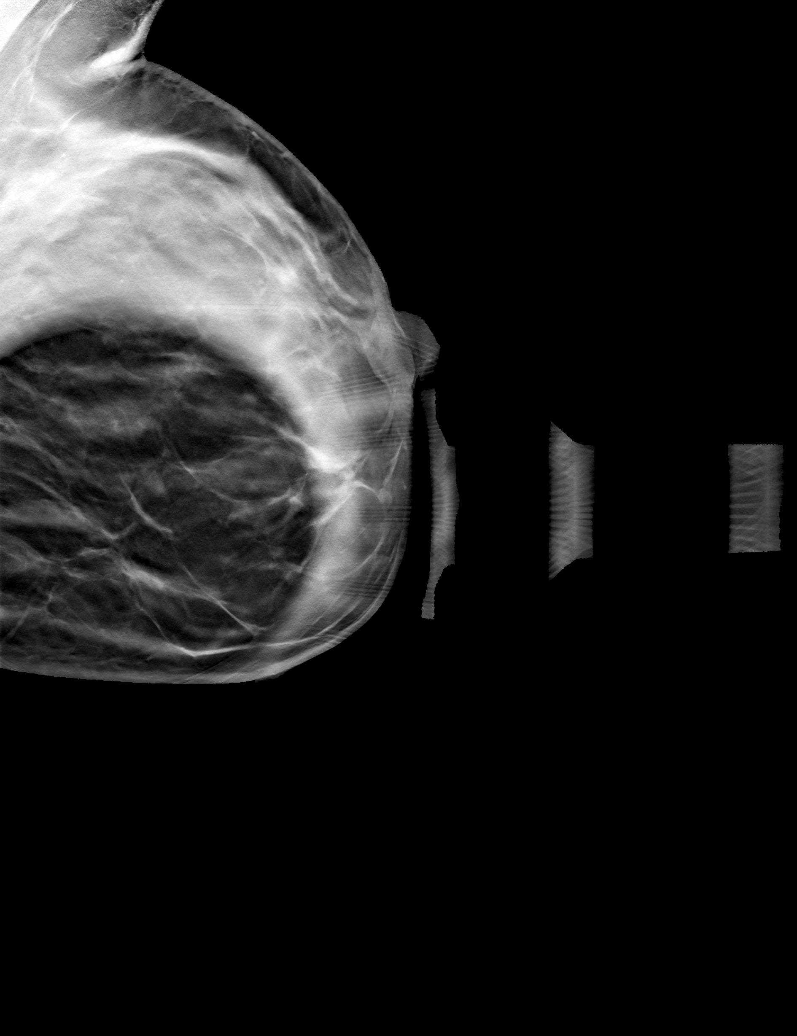

[6 of 14 positions shown; findings below may reference images not displayed]

ACR Breast Density Category c: The breast tissue is heterogeneously
dense, which may obscure small masses.
FINDINGS: Focal spot compression views of the lower outer quadrant of the left
breast show dispersion of fibroglandular tissue. There is no mass or
architectural distortion. No suspicious findings.

Mammographic images were processed with CAD.
IMPRESSION: No evidence of malignancy in the left breast.

RECOMMENDATION:
Screening mammogram in one year.(Code:G4-P-DRI)

I have discussed the findings and recommendations with the patient.
Results were also provided in writing at the conclusion of the
visit. If applicable, a reminder letter will be sent to the patient
regarding the next appointment.

BI-RADS CATEGORY  1: Negative.

## 2018-12-27 ENCOUNTER — Encounter: Payer: Self-pay | Admitting: Family Medicine

## 2019-04-07 ENCOUNTER — Encounter: Payer: Self-pay | Admitting: Family Medicine

## 2020-03-12 ENCOUNTER — Other Ambulatory Visit: Payer: Self-pay | Admitting: Obstetrics and Gynecology

## 2020-03-12 DIAGNOSIS — Z9189 Other specified personal risk factors, not elsewhere classified: Secondary | ICD-10-CM

## 2020-04-02 ENCOUNTER — Other Ambulatory Visit: Payer: Self-pay

## 2020-06-24 ENCOUNTER — Other Ambulatory Visit: Payer: Self-pay

## 2021-04-26 ENCOUNTER — Other Ambulatory Visit: Payer: Self-pay | Admitting: Obstetrics and Gynecology

## 2021-04-26 DIAGNOSIS — Z9189 Other specified personal risk factors, not elsewhere classified: Secondary | ICD-10-CM

## 2022-05-03 ENCOUNTER — Other Ambulatory Visit: Payer: Self-pay | Admitting: Obstetrics and Gynecology

## 2022-05-03 DIAGNOSIS — R928 Other abnormal and inconclusive findings on diagnostic imaging of breast: Secondary | ICD-10-CM

## 2022-05-19 ENCOUNTER — Ambulatory Visit
Admission: RE | Admit: 2022-05-19 | Discharge: 2022-05-19 | Disposition: A | Discharge status: Home / Self Care | Payer: Self-pay | Source: Ambulatory Visit | Attending: Obstetrics and Gynecology | Admitting: Obstetrics and Gynecology

## 2022-05-19 ENCOUNTER — Ambulatory Visit: Payer: Self-pay

## 2022-05-19 ENCOUNTER — Ambulatory Visit
Admission: RE | Admit: 2022-05-19 | Discharge: 2022-05-19 | Disposition: A | Discharge status: Home / Self Care | Payer: Managed Care, Other (non HMO) | Source: Ambulatory Visit | Attending: Obstetrics and Gynecology | Admitting: Obstetrics and Gynecology

## 2022-05-19 DIAGNOSIS — R928 Other abnormal and inconclusive findings on diagnostic imaging of breast: Secondary | ICD-10-CM

## 2022-05-22 ENCOUNTER — Other Ambulatory Visit: Payer: Self-pay | Admitting: Obstetrics and Gynecology

## 2022-05-22 DIAGNOSIS — N6002 Solitary cyst of left breast: Secondary | ICD-10-CM

## 2022-11-22 ENCOUNTER — Ambulatory Visit
Admission: RE | Admit: 2022-11-22 | Discharge: 2022-11-22 | Disposition: A | Discharge status: Home / Self Care | Payer: Managed Care, Other (non HMO) | Source: Ambulatory Visit | Attending: Obstetrics and Gynecology | Admitting: Obstetrics and Gynecology

## 2022-11-22 ENCOUNTER — Other Ambulatory Visit: Payer: Self-pay | Admitting: Obstetrics and Gynecology

## 2022-11-22 DIAGNOSIS — N6002 Solitary cyst of left breast: Secondary | ICD-10-CM

## 2023-05-25 ENCOUNTER — Ambulatory Visit
Admission: RE | Admit: 2023-05-25 | Discharge: 2023-05-25 | Disposition: A | Discharge status: Home / Self Care | Payer: Managed Care, Other (non HMO) | Source: Ambulatory Visit | Attending: Obstetrics and Gynecology

## 2023-05-25 ENCOUNTER — Ambulatory Visit
Admission: RE | Admit: 2023-05-25 | Discharge: 2023-05-25 | Disposition: A | Discharge status: Home / Self Care | Payer: Managed Care, Other (non HMO) | Source: Ambulatory Visit | Attending: Obstetrics and Gynecology | Admitting: Obstetrics and Gynecology

## 2023-05-25 DIAGNOSIS — N6002 Solitary cyst of left breast: Secondary | ICD-10-CM

## 2024-03-19 ENCOUNTER — Encounter (HOSPITAL_BASED_OUTPATIENT_CLINIC_OR_DEPARTMENT_OTHER): Payer: Self-pay | Admitting: Nurse Practitioner

## 2024-03-19 ENCOUNTER — Other Ambulatory Visit (HOSPITAL_BASED_OUTPATIENT_CLINIC_OR_DEPARTMENT_OTHER): Payer: Self-pay | Admitting: Nurse Practitioner

## 2024-03-19 DIAGNOSIS — R7982 Elevated C-reactive protein (CRP): Secondary | ICD-10-CM

## 2024-03-19 DIAGNOSIS — E785 Hyperlipidemia, unspecified: Secondary | ICD-10-CM

## 2024-03-21 ENCOUNTER — Ambulatory Visit (HOSPITAL_BASED_OUTPATIENT_CLINIC_OR_DEPARTMENT_OTHER)
Admission: RE | Admit: 2024-03-21 | Discharge: 2024-03-21 | Disposition: A | Discharge status: Home / Self Care | Payer: Self-pay | Source: Ambulatory Visit | Attending: Nurse Practitioner | Admitting: Nurse Practitioner

## 2024-03-21 DIAGNOSIS — E785 Hyperlipidemia, unspecified: Secondary | ICD-10-CM | POA: Insufficient documentation

## 2024-03-21 DIAGNOSIS — R7982 Elevated C-reactive protein (CRP): Secondary | ICD-10-CM | POA: Insufficient documentation

## 2024-04-30 ENCOUNTER — Other Ambulatory Visit: Payer: Self-pay | Admitting: Obstetrics and Gynecology

## 2024-04-30 DIAGNOSIS — N6320 Unspecified lump in the left breast, unspecified quadrant: Secondary | ICD-10-CM

## 2024-05-01 ENCOUNTER — Encounter: Payer: Self-pay | Admitting: Obstetrics and Gynecology

## 2024-05-26 ENCOUNTER — Encounter

## 2024-05-26 ENCOUNTER — Other Ambulatory Visit

## 2024-05-27 ENCOUNTER — Encounter

## 2024-05-27 DIAGNOSIS — Z1231 Encounter for screening mammogram for malignant neoplasm of breast: Secondary | ICD-10-CM

## 2024-06-24 DIAGNOSIS — Z9189 Other specified personal risk factors, not elsewhere classified: Secondary | ICD-10-CM

## 2024-06-26 ENCOUNTER — Ambulatory Visit
Admission: RE | Admit: 2024-06-26 | Discharge: 2024-06-26 | Disposition: A | Discharge status: Home / Self Care | Source: Ambulatory Visit | Attending: Obstetrics and Gynecology | Admitting: Obstetrics and Gynecology

## 2024-06-26 ENCOUNTER — Encounter

## 2024-06-26 ENCOUNTER — Inpatient Hospital Stay: Admission: RE | Admit: 2024-06-26 | Source: Ambulatory Visit

## 2024-06-26 DIAGNOSIS — N6320 Unspecified lump in the left breast, unspecified quadrant: Secondary | ICD-10-CM
